# Patient Record
Sex: Male | Born: 1935 | Race: White | Hispanic: No | Smoking: Former smoker
Health system: Southern US, Community
[De-identification: ages and names within clinical notes are randomized; demographics above are authoritative.]

## PROBLEM LIST (undated history)

## (undated) DIAGNOSIS — K5730 Diverticulosis of large intestine without perforation or abscess without bleeding: Secondary | ICD-10-CM

## (undated) DIAGNOSIS — F0390 Unspecified dementia without behavioral disturbance: Secondary | ICD-10-CM

## (undated) DIAGNOSIS — K635 Polyp of colon: Secondary | ICD-10-CM

## (undated) DIAGNOSIS — N400 Enlarged prostate without lower urinary tract symptoms: Secondary | ICD-10-CM

## (undated) DIAGNOSIS — R338 Other retention of urine: Secondary | ICD-10-CM

## (undated) DIAGNOSIS — L820 Inflamed seborrheic keratosis: Secondary | ICD-10-CM

## (undated) DIAGNOSIS — G4730 Sleep apnea, unspecified: Secondary | ICD-10-CM

## (undated) DIAGNOSIS — K430 Incisional hernia with obstruction, without gangrene: Secondary | ICD-10-CM

## (undated) DIAGNOSIS — A419 Sepsis, unspecified organism: Secondary | ICD-10-CM

## (undated) DIAGNOSIS — M170 Bilateral primary osteoarthritis of knee: Secondary | ICD-10-CM

## (undated) DIAGNOSIS — H3530 Unspecified macular degeneration: Secondary | ICD-10-CM

## (undated) DIAGNOSIS — R39198 Other difficulties with micturition: Secondary | ICD-10-CM

## (undated) HISTORY — DX: Sepsis, unspecified organism: A41.9

## (undated) HISTORY — DX: Other difficulties with micturition: R39.198

## (undated) HISTORY — DX: Unspecified macular degeneration: H35.30

## (undated) HISTORY — DX: Unspecified dementia, unspecified severity, without behavioral disturbance, psychotic disturbance, mood disturbance, and anxiety: F03.90

## (undated) HISTORY — DX: Bilateral primary osteoarthritis of knee: M17.0

## (undated) HISTORY — DX: Polyp of colon: K63.5

## (undated) HISTORY — DX: Inflamed seborrheic keratosis: L82.0

## (undated) HISTORY — DX: Incisional hernia with obstruction, without gangrene: K43.0

## (undated) HISTORY — DX: Diverticulosis of large intestine without perforation or abscess without bleeding: K57.30

## (undated) HISTORY — DX: Other retention of urine: R33.8

## (undated) HISTORY — PX: CATARACT EXTRACTION: SUR2

## (undated) HISTORY — PX: INGUINAL HERNIA REPAIR: SHX194

---

## 1985-08-11 HISTORY — PX: CHOLECYSTECTOMY: SHX55

## 1988-08-11 HISTORY — PX: STOMACH SURGERY: SHX791

## 1998-01-19 ENCOUNTER — Other Ambulatory Visit: Admission: RE | Admit: 1998-01-19 | Discharge: 1998-01-19 | Payer: Self-pay | Admitting: Dermatology

## 1998-07-30 ENCOUNTER — Ambulatory Visit: Admission: RE | Admit: 1998-07-30 | Discharge: 1998-07-30 | Payer: Self-pay | Admitting: Pulmonary Disease

## 1999-01-14 ENCOUNTER — Ambulatory Visit: Admission: RE | Admit: 1999-01-14 | Discharge: 1999-01-14 | Payer: Self-pay | Admitting: Pulmonary Disease

## 1999-05-17 ENCOUNTER — Ambulatory Visit (HOSPITAL_COMMUNITY): Admission: RE | Admit: 1999-05-17 | Discharge: 1999-05-17 | Payer: Self-pay | Admitting: Gastroenterology

## 2007-08-12 DIAGNOSIS — R338 Other retention of urine: Secondary | ICD-10-CM

## 2007-08-12 DIAGNOSIS — K430 Incisional hernia with obstruction, without gangrene: Secondary | ICD-10-CM

## 2007-08-12 HISTORY — PX: LAPAROSCOPIC ASSISTED VENTRAL HERNIA REPAIR: SHX6312

## 2007-08-12 HISTORY — DX: Incisional hernia with obstruction, without gangrene: K43.0

## 2007-08-12 HISTORY — DX: Other retention of urine: R33.8

## 2007-09-25 ENCOUNTER — Emergency Department (HOSPITAL_COMMUNITY): Admission: EM | Admit: 2007-09-25 | Discharge: 2007-09-25 | Payer: Self-pay | Admitting: Emergency Medicine

## 2007-11-16 ENCOUNTER — Ambulatory Visit (HOSPITAL_COMMUNITY): Admission: RE | Admit: 2007-11-16 | Discharge: 2007-11-16 | Payer: Self-pay | Admitting: General Surgery

## 2007-11-20 ENCOUNTER — Inpatient Hospital Stay (HOSPITAL_COMMUNITY): Admission: RE | Admit: 2007-11-20 | Discharge: 2007-11-22 | Payer: Self-pay | Admitting: Surgery

## 2009-07-10 ENCOUNTER — Encounter: Admission: RE | Admit: 2009-07-10 | Discharge: 2009-07-10 | Payer: Self-pay | Admitting: Family Medicine

## 2009-07-19 ENCOUNTER — Emergency Department (HOSPITAL_COMMUNITY): Admission: EM | Admit: 2009-07-19 | Discharge: 2009-07-19 | Payer: Self-pay | Admitting: Emergency Medicine

## 2009-08-14 ENCOUNTER — Encounter: Admission: RE | Admit: 2009-08-14 | Discharge: 2009-08-14 | Payer: Self-pay | Admitting: Neurosurgery

## 2010-09-01 ENCOUNTER — Encounter: Payer: Self-pay | Admitting: Neurosurgery

## 2010-12-24 NOTE — Discharge Summary (Signed)
NAME:  Nicholas Herring, Nicholas Herring NO.:  [id number]   MEDICAL RECORD NO.:  [id number]          PATIENT TYPE:  INP   LOCATION:  1408                         FACILITY:  [facility]   PHYSICIAN:  Nicholas Herring, M.D.  DATE OF BIRTH:  [birth year]   DATE OF ADMISSION:  11/18/2007  DATE OF DISCHARGE:  11/22/2007                               DISCHARGE SUMMARY   DISCHARGE DIAGNOSES:  1. Incarcerated ventral hernia, incarcerated umbilical hernia.  2. Postoperative ileus, resolved.  3. Postoperative urinary retention, resolved.  4. Allergies.   OPERATIONS PERFORMED:  The patient had a laparoscopic ventral hernia  repair on November 18, 2007, by Nicholas Herring, M.D.   HISTORY OF ILLNESS:  Nicholas Herring is a 77 year old white male patient of  Nicholas Herring of [facility] and Nicholas Herring, who had an  open cholecystectomy in 1987, had a hiatal hernia repair about 1990.  Developed a hernia in his upper abdomen that included the junction of  these two incisions.  He also has a chronically incarcerated umbilical  hernia.   He was originally scheduled for Nicholas Herring on Tuesday, November 16, 2007.  Because of a miscommunication, the case was cancelled.  I then talked to  him and brought him to the hospital for attempted laparoscopic ventral  hernia repair.   PAST MEDICAL HISTORY:  For his age, remarkably unremarkable.  He has  allergies, for which he is taking Xyzal, but no other significant  medical problems.   PHYSICAL EXAM:  He has this defect in his right upper quadrant between  his right subcostal and midline incision consistent with an incarcerated  ventral hernia, and then he has an umbilical hernia.  Neither, however,  are tender.  They are just chronically incarcerated, probably with  omentum.   HOSPITAL COURSE:  The patient was taken to the operating room on the day  of admission, where he underwent a laparoscopic ventral hernia repair  and a laparoscopic umbilical hernia  repair, covered with a single piece  of Parietex mesh.   Postoperatively the patient developed urinary retention the first  postoperative day.  I put a Foley in him and started him on Flomax.  We  left the Foley in for two days, then removed it, and he tolerated it  well.   Secondly, he developed abdominal distention consistent with a  postoperative ileus and I had to keep him for a couple of days for that  reason also, but he is now four days postop.  He is afebrile.  His  abdomen is soft.  His incisions look good.  He is passing flatus but he  is still mildly distended.   He is only on liquids but I think he can go home and advance his diet  slowly, and he is aware of that.   DISCHARGE INSTRUCTIONS:  1. Resume his home medicines, which were all p.r.n. medicines.  I gave      him some Vicodin for pin.  2. He is to eat lightly for a few days.  3. He can shower.  4. He is to  see me back in 3 weeks for follow-up.  5. He is given Vicodin for pain.   DISCHARGE CONDITION:  Good.      Nicholas Herring, M.D.  Electronically Signed     DHN/MEDQ  D:  11/22/2007  T:  11/22/2007  Job:  [id number]   cc:   Nicholas Herring, M.D.  Fax: [phone]   Nicholas Herring, M.D.  Fax: [phone]

## 2010-12-24 NOTE — Op Note (Signed)
NAME:  Nicholas Herring, Nicholas Herring NO.:  [id number]   MEDICAL RECORD NO.:  [id number]          PATIENT TYPE:  AMB   LOCATION:  DAY                          FACILITY:  [facility]   PHYSICIAN:  Sandria Bales. Ezzard Standing, M.D.  DATE OF BIRTH:  1926-09-05   DATE OF PROCEDURE:  11/18/2007  DATE OF DISCHARGE:                               OPERATIVE REPORT   Date of surgery ???   PREOPERATIVE DIAGNOSES:  1. Incarcerated ventral hernia.  2. Incarcerated umbilical hernia.   POSTOPERATIVE DIAGNOSES:  1. Incarcerated ventral hernia, approximately 5-cm defect.  2. Swiss cheese defect of upper abdomen.  3. Incarcerated 1.5-cm umbilical hernia.   PROCEDURE:  Laparoscopic repair of ventral hernia/umbilical hernia with  15 x 20-cm Parietex mesh.   SURGEON:  Sandria Bales. Ezzard Standing, M.D.  No first assistant.   ANESTHESIA:  General endotracheal.   ESTIMATED BLOOD LOSS:  Minimal.   INDICATION FOR PROCEDURE:  Mr. Serio is a 77 year old white male who  sees Dr. Robert Bellow as his primary medical doctor, Dr. Ritta Slot from  a GI standpoint.  He presented to the emergency room at [facility] on September 25, 2007, with abdominal pain.  This was felt to be  in part due to an incarcerated abdominal wall hernia.   I have discussed with him about repairing this hernia laparoscopically.  I have discussed the indications and potential complications.  The  potential complications include but are not limited to bleeding,  infection, nerve injury, recurrence of the hernia and bowel injury.   OPERATIVE NOTE:  Patient placed in a supine position, given a general  endotracheal anesthetic.  He was given 1 g of Ancef at the initiation of  the procedure.  His abdomen was prepped with Betadine solution and  sterilely draped.  I did put an Ioban drape over his abdomen.  I  accessed the abdomen through the left upper quadrant and U got a 10-mm  Ethicon Optiview trocar in the abdominal cavity.  I then placed a  5-mm  in the left lower quadrant and a 5-mm right lower quadrant trocar.   I spent probably an hour taking down adhesions.  He had an umbilical  hernia which was incarcerated.  I reduced most of this.  There may be  some fat in the hernia so there may be a little bit of a bulge.  He had  a 4.5-5 cm ventral hernia defect sort of between his upper midline  incision and his subcostal incision on the right.  This was incarcerated  with omental fat, which I reduced.  He also had three or four other  small abdominal hernias which were sort of like Swiss-cheese defects  probably about a centimeter in size, sort of around this larger hernia.   It looked like I could cover both the ventral hernias, the Swiss-cheese  defects, and the umbilical hernia with a single piece of mesh and that a  15 x 20-cm Parietex mesh would be appropriate.   I put eight sutures in the mesh.  I then inserted the mesh into the  abdominal cavity  and retrieved these sutures using an endo-suture  device.  I tied this down, which then pulled the mesh to the anterior  abdominal wall, then fired the stapler, I think approximately 38 times.  I stapled at 1-cm intervals around the edge of the mesh and then I put  some staples in the middle to give a little bit of a tenting effect to  the mesh.  I desufflated the abdomen once and looked at the mesh and it  looked like there were no gaps, and then reinsufflated the mesh and then  pulled my trocars out entirely.  I also reinspected the omentum.  I  think there was bleeding some early but at the end of the procedure it  looked like that had all been controlled.   I closed each of the trocar sites with a 5-0 Vicryl suture.  I painted  each wound with tincture of Benzoin and steri-stripped.  The patient  tolerated the procedure well, was transported to the recovery room in  good condition.  The sponge and needle count were correct at the end of  the case.      Sandria Bales.  Ezzard Standing, M.D.  Electronically Signed     DHN/MEDQ  D:  11/18/2007  T:  11/18/2007  Job:  [id number]   cc:   Jonita Albee, M.D.  Fax: [phone]   Griffith Citron, M.D.  Fax: [phone]

## 2010-12-24 NOTE — Consult Note (Signed)
NAME:  Nicholas, Herring NO.:  [id number]   MEDICAL RECORD NO.:  [id number]          PATIENT TYPE:  EMS   LOCATION:  ED                           FACILITY:  [facility]   PHYSICIAN:  Anselm Pancoast. Weatherly, M.D.DATE OF BIRTH:  1926-10-28   DATE OF CONSULTATION:  09/25/2007  DATE OF DISCHARGE:                                 CONSULTATION   CHIEF COMPLAINT:  Painful hernia.   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  He has also had a past  history of polyps removed by Dr. Kinnie Scales and says it has been probably 5  years since he has had a colonoscopy.  When he came to the emergency  room, at first while he was eating at the pain, then he went home, his  wife drove, and after lying down and not improving, he came to the  emergency room, arrived here about 4:30 and was seen by the ER  physician, given some pain medication and then the hernia kind of  spontaneously reduced and he has been asymptomatic since then.  They  went ahead and proceeded with a CT that shows a little bit of gas in the  small bowel colon.  There is no incarceration of anything in the hernia  at this time.  It is probably a 50-cent fascial defect with a fairly  prominent hernia sac that goes up in the area and the patient is  completely asymptomatic now.   He says that he is not on any chronic medications as far as high blood  pressure or cardiac medications, etc.  He a session of court this next  week.  He has also has some inguinal hernias repaired, I think he has  had  mesh in those, which he thinks Dr. Earlene Plater has done his surgeries.   When he first arrived the white count was 14,800, his hematocrit 44.  CMET was unremarkable.  Urinalysis is negative with no blood or pus  cells in his urine.  He has never been febrile and the ER physician said  that basically while he was examining him after the pain medication, the  hernia just kind of spontaneously reduced.  He did not actually reduce  it physically.   PHYSICAL EXAM:  At this time is about 6 o'clock.  His blood pressure  133/68, his pulse 78, respirations 16.  He has always been afebrile.  He has no cramping or active bowel sounds.  He is not nauseated and the  hernia, when he strains it protrudes up but when he relaxes, it reduces  spontaneously, and he is certainly not  tender in the other parts of his  abdomen.    I did not do a rectal examination but would recommend that we see him  electively in the office.  He is going to contact Dr. Jennye Boroughs office.  He said Dr. Kinnie Scales told him 2 years ago he needed another colonoscopy,  and I would recommend that if he needs, go ahead and get it done.  Clinically now I think it is completely unrelated to the hernia but if  he needs anything done, they would be wise to get that checked first.  If he would have cramping abdominal pain or anything, he will see me  sooner.  Otherwise he will call and get an appointment with either me or  Dr. Kendrick Ranch.  He says his schedule for about the next 2-3 weeks is  busy but he is kind on call.  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  The patient  was accompanied by his wife, who  will be driving him home, and they are aware that if he is having any  cramping lower abdominal pain to give [place] a call an we will be happy to  see him back in the emergency room.   IMPRESSION:  Incisional hernia from an old cholecystectomy midline  incision from previous hiatus hernia  repair.           ______________________________  Anselm Pancoast. Zachery Dakins, M.D.     WJW/MEDQ  D:  09/25/2007  T:  09/27/2007  Job:  [id number]

## 2011-05-02 LAB — CBC
HCT: 44.1
MCHC: 34.6
Platelets: 186
RBC: 4.75

## 2011-05-02 LAB — COMPREHENSIVE METABOLIC PANEL
ALT: 20
AST: 17
BUN: 15
Chloride: 103
Glucose, Bld: 181 — ABNORMAL HIGH
Sodium: 137

## 2011-05-02 LAB — DIFFERENTIAL: Monocytes Relative: 4

## 2011-05-02 LAB — URINALYSIS, ROUTINE W REFLEX MICROSCOPIC
Bilirubin Urine: NEGATIVE
Ketones, ur: 40 — AB
Nitrite: NEGATIVE

## 2011-05-06 LAB — CBC
HCT: 45.1
MCV: 93.1
Platelets: 160
RDW: 14.2
WBC: 8

## 2011-05-06 LAB — COMPREHENSIVE METABOLIC PANEL
Albumin: 3.7
Alkaline Phosphatase: 80
CO2: 28
Chloride: 104
Creatinine, Ser: 0.89
Total Protein: 6.3

## 2011-05-06 LAB — DIFFERENTIAL
Basophils Absolute: 0.1
Basophils Relative: 1
Eosinophils Absolute: 0.4
Lymphocytes Relative: 21
Lymphs Abs: 1.7
Monocytes Absolute: 0.6
Neutrophils Relative %: 65

## 2011-08-12 DIAGNOSIS — L820 Inflamed seborrheic keratosis: Secondary | ICD-10-CM

## 2011-08-12 HISTORY — DX: Inflamed seborrheic keratosis: L82.0

## 2012-05-06 ENCOUNTER — Other Ambulatory Visit: Payer: Self-pay | Admitting: Dermatology

## 2013-02-01 ENCOUNTER — Ambulatory Visit: Payer: 59

## 2013-02-01 ENCOUNTER — Ambulatory Visit (INDEPENDENT_AMBULATORY_CARE_PROVIDER_SITE_OTHER): Payer: 59 | Admitting: Family Medicine

## 2013-02-01 VITALS — BP 117/62 | HR 77 | Temp 98.0°F | Resp 17 | Ht 70.0 in | Wt 193.0 lb

## 2013-02-01 DIAGNOSIS — S0181XA Laceration without foreign body of other part of head, initial encounter: Secondary | ICD-10-CM

## 2013-02-01 DIAGNOSIS — S61209A Unspecified open wound of unspecified finger without damage to nail, initial encounter: Secondary | ICD-10-CM

## 2013-02-01 DIAGNOSIS — M79609 Pain in unspecified limb: Secondary | ICD-10-CM

## 2013-02-01 DIAGNOSIS — S62609A Fracture of unspecified phalanx of unspecified finger, initial encounter for closed fracture: Secondary | ICD-10-CM

## 2013-02-01 DIAGNOSIS — S0180XA Unspecified open wound of other part of head, initial encounter: Secondary | ICD-10-CM

## 2013-02-01 DIAGNOSIS — M79642 Pain in left hand: Secondary | ICD-10-CM

## 2013-02-01 NOTE — Progress Notes (Signed)
79 year old gentleman comes in with his wife  ( who is on oxygen) after he fell while carrying porkchops out to the [place]. He had his left for head, his left chin, his left hand with lacerations to each.  Had no loss of consciousness, no nausea or vomiting, no diplopia, no significant headache.  Patient is not taking anticoagulants at this time.  Objective: Patient is alert, cooperative, and appropriate Neurologically: Patient has a normal mental status, normal cranial nerves III through XII, normal motor exam, normal gait Pupils equal and reactive to light and accommodation with full range of motion Normal facial movement, no chipped teeth, no oral lesions Full range of jaw, lower cheek has a 2 cm linear laceration which was Dermabond Left for head shows ecchymotic swelling of about 1 cm with overlying half centimeter linear laceration which is closed and not bleeding Patient has an angular laceration of the left hand at the base of the thumb which was repaired by the physician's assistant [facility] reading (PRIMARY) by  Dr. Milus Glazier:  Base of pinky and ring finger metacarpals show fractures with 1/4" displacement   Assessment: Closed head injury without signs of concussion, facial laceration which was closed with Dermabond, for head contusion which is small and associated with small laceration, left hand 3 cm angulated superficial laceration which was repaired, and boxer type fracture left hand  Plan: Pain medicine and orthopedic consultation. Followup for lacerations in one week Patient will try aleve for pain control.  If not sufficient, we can call in Ultram 50 mg #30 every 6 hrs prn  Signed, Elvina Sidle, MD.

## 2013-02-01 NOTE — Progress Notes (Signed)
Verbal consent obtained from the patient.  Local anesthesia with 2cc Lidocaine 2% without epinephrine to the wound on the LEFT hand.  Wound scrubbed with soap and water and rinsed.  Wound closed with #2 4-0 Ethilon horizontal mattress sutures.  Wound cleansed and dressed.  Ulnar gutter splint placed.

## 2013-08-09 ENCOUNTER — Emergency Department (HOSPITAL_COMMUNITY)
Admission: EM | Admit: 2013-08-09 | Discharge: 2013-08-09 | Disposition: A | Discharge status: Home / Self Care | Payer: Medicare Other | Attending: Emergency Medicine | Admitting: Emergency Medicine

## 2013-08-09 ENCOUNTER — Encounter (HOSPITAL_COMMUNITY): Payer: Self-pay | Admitting: Emergency Medicine

## 2013-08-09 DIAGNOSIS — Z87891 Personal history of nicotine dependence: Secondary | ICD-10-CM | POA: Insufficient documentation

## 2013-08-09 DIAGNOSIS — Z9089 Acquired absence of other organs: Secondary | ICD-10-CM | POA: Insufficient documentation

## 2013-08-09 DIAGNOSIS — K469 Unspecified abdominal hernia without obstruction or gangrene: Secondary | ICD-10-CM | POA: Insufficient documentation

## 2013-08-09 NOTE — ED Provider Notes (Signed)
CSN: [id number]     Arrival date & time 08/09/13  1213 History   First MD Initiated Contact with Patient 08/09/13 1540     Chief Complaint  Patient presents with  . Hernia    HPI Patient presents to the emergency room with complaints of a presumed hernia. Patient states over the last several months he's had intermittent pain and swelling in his right inguinal region. Patient states the symptoms would come and go. He really never that bothersome so he did not seek any medical treatment. Patient has a history of a right inguinal hernia repair and felt that this was very similar to his hernia symptoms previously. This morning, he was having swelling and increasing discomfort. The hernia pain persisted for several hours so he decided to come to the emergency room to be evaluated. Prior to being seen, the patient states all his symptoms have resolved. He no longer is having any pain or swelling. He has not any trouble with nausea, vomiting, diarrhea or fever. Past Medical History  Diagnosis Date  . Hernia    Past Surgical History  Procedure Laterality Date  . Cholecystectomy    . Hernia repair     History reviewed. No pertinent family history. History  Substance Use Topics  . Smoking status: Former Games developer  . Smokeless tobacco: Not on file  . Alcohol Use: Yes    Review of Systems  All other systems reviewed and are negative.    Allergies  Morphine and related  Home Medications  No current outpatient prescriptions on file. BP 131/69  Pulse 74  Temp(Src) 97.7 F (36.5 C) (Oral)  Resp 17  SpO2 97% Physical Exam  Nursing note and vitals reviewed. Constitutional: He appears well-developed and well-nourished. No distress.  HENT:  Head: Normocephalic and atraumatic.  Right Ear: External ear normal.  Left Ear: External ear normal.  Eyes: Conjunctivae are normal. Right eye exhibits no discharge. Left eye exhibits no discharge. No scleral icterus.  Neck: Neck supple. No tracheal  deviation present.  Cardiovascular: Normal rate.   Pulmonary/Chest: Effort normal. No stridor. No respiratory distress.  Abdominal: Soft. Bowel sounds are normal. He exhibits no distension. There is no tenderness. There is no rebound and no guarding.  Well-healed inguinal hernia scar on the right side, no palpable mass with patient standing or during Valsalva maneuver  Musculoskeletal: He exhibits no edema.  Neurological: He is alert. Cranial nerve deficit: no gross deficits.  Skin: Skin is warm and dry. No rash noted.  Psychiatric: He has a normal mood and affect.    ED Course  Procedures (including critical care time) Labs Review Labs Reviewed - No data to display Imaging Review No results found.  EKG Interpretation   None       MDM   1. Hernia     I suspect the patient has an inguinal hernia. Was not able to reproduce it on my exam. Fortunately, he has no evidence to suggest an incarcerated restraining you hernia.  The patient has seen Dr. Ezzard Standing previously. Recommend he followup with him and schedule an appointment. Patient understands to return to emergency room for recurrent symptoms, vomiting, fever, persistent swelling   Celene Kras, MD 08/09/13 [id number]

## 2013-08-09 NOTE — ED Notes (Signed)
Pt states he has hernia, RLQ, states he has these before, states he has surgery before, states painful when pressing down, no pain at present

## 2013-08-17 ENCOUNTER — Encounter (INDEPENDENT_AMBULATORY_CARE_PROVIDER_SITE_OTHER): Payer: Self-pay | Admitting: Surgery

## 2013-08-17 ENCOUNTER — Ambulatory Visit (INDEPENDENT_AMBULATORY_CARE_PROVIDER_SITE_OTHER): Payer: Medicare Other | Admitting: Surgery

## 2013-08-17 ENCOUNTER — Encounter (HOSPITAL_COMMUNITY): Payer: Self-pay

## 2013-08-17 VITALS — BP 102/62 | HR 72 | Temp 97.3°F | Resp 14 | Ht 72.0 in | Wt 181.4 lb

## 2013-08-17 DIAGNOSIS — R3989 Other symptoms and signs involving the genitourinary system: Secondary | ICD-10-CM

## 2013-08-17 DIAGNOSIS — R39198 Other difficulties with micturition: Secondary | ICD-10-CM

## 2013-08-17 DIAGNOSIS — K4091 Unilateral inguinal hernia, without obstruction or gangrene, recurrent: Secondary | ICD-10-CM

## 2013-08-17 DIAGNOSIS — K4021 Bilateral inguinal hernia, without obstruction or gangrene, recurrent: Secondary | ICD-10-CM | POA: Insufficient documentation

## 2013-08-17 DIAGNOSIS — M170 Bilateral primary osteoarthritis of knee: Secondary | ICD-10-CM | POA: Insufficient documentation

## 2013-08-17 DIAGNOSIS — K4090 Unilateral inguinal hernia, without obstruction or gangrene, not specified as recurrent: Secondary | ICD-10-CM

## 2013-08-17 DIAGNOSIS — K449 Diaphragmatic hernia without obstruction or gangrene: Secondary | ICD-10-CM | POA: Insufficient documentation

## 2013-08-17 HISTORY — DX: Bilateral primary osteoarthritis of knee: M17.0

## 2013-08-17 MED ORDER — TAMSULOSIN HCL 0.4 MG PO CAPS: 0.4000 mg | ORAL_CAPSULE | Freq: Every day | ORAL | Status: DC

## 2013-08-17 NOTE — Progress Notes (Signed)
Subjective:     Patient ID: Nicholas Herring, male   DOB: 08-Sep-1926, 80 y.o.   MRN: [id number]  HPI  Note: This dictation was prepared with Dragon/digital dictation along with [facility] technology. Any transcriptional errors that result from this process are unintentional.       LAURENCE CROFFORD  May 02, 1927 [id number]  Patient Care Team: Jonita Albee, MD as PCP - General (Internal Medicine) Griffith Citron, MD as Consulting Physician (Gastroenterology) Lunette Stands, MD as Consulting Physician (Orthopedic Surgery)  This patient is a 80 y.o.male who presents today for surgical evaluation at the request of Dr. Linwood Dibbles, [facility] ED.   Reason for visit: Intermittent right groin bulging with pain/discomfort.  Concern for recurrent hernia.  Pleasant elderly gentleman who tends to avoid doctors.  Used to be very active.  Worsening arthritis of knees has limited some mobility.  He had a hernia repair of his right groin as a child.  Found to have an incarcerated periumbilical ventral hernia 5 years ago.  It took some time sorting out his history, but eventually I found out he actually had surgery with [place].  Initially planned to have surgery Dr. Zachery Dakins.  Lost to followup.  He came in urgently.  Had to have surgery done by Dr. Ezzard Standing April 2009.  Eventually recovered from that.  He is still rather active.  A few weeks ago he noticed a painful lump in his right groin.  If he lies down it can reduce.  It recurred.  He went to the emergency room.  Suspicion but no definite diagnosis of hernia.  Surgical consultation recommended.  The patient notes that he can be quite uncomfortable when it is out.  He pretty much as to stay in bed until it can massaged back in.  This concerns him.  He wishes to be aggressive and have something done.  Therefore, he comes to the office considering surgery.  Patient Active Problem List   Diagnosis Date Noted  . Hiatal hernia 08/17/2013  . Osteoarthritis of both knees  08/17/2013  . Recurrent right inguinal hernia 08/17/2013  . Inguinal hernia, left 08/17/2013  . Difficulty in urination     Past Medical History  Diagnosis Date  . Incarcerated incisional hernia 2009  . Difficulty in urination   . Macular degeneration   . Colon polyps   . Seborrheic keratoses, inflamed 2013  . Diverticulosis of sigmoid colon   . Osteoarthritis of both knees 08/17/2013  . Acute urinary retention 2009    Resolved after foley & flomax    Past Surgical History  Procedure Laterality Date  . Cholecystectomy  1987  . Laparoscopic assisted ventral hernia repair  2009    Dr Ezzard Standing  . Stomach surgery  1990    reflux surgery?  . Inguinal hernia repair Right infant    History   Social History  . Marital Status: Married    Spouse Name: N/A    Number of Children: N/A  . Years of Education: N/A   Occupational History  . Retired   . Lawyer/Judge    Social History Main Topics  . Smoking status: Former Smoker    Quit date: 08/12/1971  . Smokeless tobacco: Never Used  . Alcohol Use: 3.6 oz/week    6 Cans of beer per week     Comment: weekly  . Drug Use: No  . Sexual Activity: Not on file   Other Topics Concern  . Not on file   Social History Narrative  Lives with his wife.  [employer], class of '52.    History reviewed. No pertinent family history.  Current Outpatient Prescriptions  Medication Sig Dispense Refill  . calcium carbonate (TUMS EX) 750 MG chewable tablet Chew 1 tablet by mouth daily.      . diphenhydrAMINE (BENADRYL) 25 MG tablet Take 25 mg by mouth every 6 (six) hours as needed.      . naproxen sodium (ANAPROX) 220 MG tablet Take 220 mg by mouth 2 (two) times daily with a meal.      . tamsulosin (FLOMAX) 0.4 MG CAPS capsule Take 1 capsule (0.4 mg total) by mouth daily.  30 capsule  1   No current facility-administered medications for this visit.     Allergies  Allergen Reactions  . Morphine And Related Nausea And  Vomiting    BP 102/62  Pulse 72  Temp(Src) 97.3 F (36.3 C) (Temporal)  Resp 14  Ht 6' (1.829 m)  Wt 181 lb 6.4 oz (82.283 kg)  BMI 24.60 kg/m2  No results found.   Review of Systems  Constitutional: Negative for fever, chills and diaphoresis.  HENT: Negative for ear discharge, facial swelling, mouth sores, nosebleeds, sore throat and trouble swallowing.   Eyes: Negative for photophobia, discharge and visual disturbance.  Respiratory: Negative for choking, chest tightness, shortness of breath and stridor.   Cardiovascular: Negative for chest pain and palpitations.  Gastrointestinal: Negative for nausea, vomiting, abdominal pain, diarrhea, constipation, blood in stool, abdominal distention, anal bleeding and rectal pain.  Endocrine: Negative for cold intolerance and heat intolerance.  Genitourinary: Negative for dysuria, urgency, difficulty urinating and testicular pain.  Musculoskeletal: Negative for arthralgias, back pain, gait problem and myalgias.  Skin: Negative for color change, pallor, rash and wound.  Allergic/Immunologic: Negative for environmental allergies and food allergies.  Neurological: Negative for dizziness, speech difficulty, weakness, numbness and headaches.  Hematological: Negative for adenopathy. Does not bruise/bleed easily.  Psychiatric/Behavioral: Negative for hallucinations, confusion and agitation.       Objective:   Physical Exam  Constitutional: He is oriented to person, place, and time. He appears well-developed and well-nourished. He is cooperative. No distress.  HENT:  Head: Normocephalic.  Right Ear: External ear normal. No drainage. Decreased hearing is noted.  Left Ear: External ear normal. No drainage. Decreased hearing is noted.  Mouth/Throat: Oropharynx is clear and moist. No oropharyngeal exudate.  Eyes: Conjunctivae and EOM are normal. Pupils are equal, round, and reactive to light. No scleral icterus.  Neck: Normal range of motion. Neck  supple. No tracheal deviation present.  Cardiovascular: Normal rate, regular rhythm and intact distal pulses.   Pulmonary/Chest: Effort normal and breath sounds normal. No respiratory distress.  Abdominal: Soft. He exhibits no distension. There is no tenderness. There is no rigidity, no rebound, no guarding, no CVA tenderness, no tenderness at McBurney's point and negative Murphy's sign. A hernia is present. Hernia confirmed positive in the right inguinal area and confirmed positive in the left inguinal area. Hernia confirmed negative in the ventral area.    Musculoskeletal: Normal range of motion. He exhibits no tenderness.  Lymphadenopathy:    He has no cervical adenopathy.       Right: No inguinal adenopathy present.       Left: No inguinal adenopathy present.  Neurological: He is alert and oriented to person, place, and time. No cranial nerve deficit. He exhibits normal muscle tone. Coordination normal.  Skin: Skin is warm and dry. No rash noted. He is  not diaphoretic. No erythema. No pallor.  Psychiatric: He has a normal mood and affect. Judgment and thought content normal. His speech is delayed. He is slowed. Cognition and memory are normal.       Assessment:     Recurrent right and probable left new inguinal hernias     Plan:     I offered observation vs. Surgery.  He notes that the hernia is popping out more often and becoming much more comfortable.  He has a pretty good quality of life and is functional.  He wishes to be aggressive and have this repaired.  He had prior hernia surgery.  On 5 years ago by my partner, Dr. Ezzard StandingNewman.  I mentioned this to him.  He felt comfortable having myself proceed with the surgery.  He would require a TAPP repair as opposed to a preperitoneal TEP type repair.  Possible open operation as backup plan.  I discussed with him:  The anatomy & physiology of the abdominal wall and pelvic floor was discussed.  The pathophysiology of hernias in the inguinal and  pelvic region was discussed.  Natural history risks such as progressive enlargement, pain, incarceration & strangulation was discussed.   Contributors to complications such as smoking, obesity, diabetes, prior surgery, etc were discussed.    I feel the risks of no intervention will lead to serious problems that outweigh the operative risks; therefore, I recommended surgery to reduce and repair the hernia.  I explained laparoscopic techniques with possible need for an open approach.  I noted usual use of mesh to patch and/or buttress hernia repair  Risks such as bleeding, infection, abscess, need for further treatment, heart attack, death, and other risks were discussed.  I noted a good likelihood this will help address the problem.   Goals of post-operative recovery were discussed as well.  Possibility that this will not correct all symptoms was explained.  I stressed the importance of low-impact activity, aggressive pain control, avoiding constipation, & not pushing through pain to minimize risk of post-operative chronic pain or injury. Possibility of reherniation was discussed.  We will work to minimize complications.     An educational handout further explaining the pathology & treatment options was given as well.  Questions were answered.  The patient expresses understanding & wishes to proceed with surgery.  With his issues of prior urinary retention with less surgery, I recommend he start Flomax up to minimize the chance of urinary retention.  If he has persistent issues, reconsider seeing oyology for this.  He tends to avoid doctors and does not want to do that.  I did not insist.

## 2013-08-17 NOTE — Patient Instructions (Signed)
See the Handout(s) we gave you.  Consider surgery To repair the hernias in your right and probable left groins.  Please call our office at [phone] if you wish to schedule surgery or if you have further questions / concerns.   Start tamsulosin/Flomax to minimize the chance that he will have urinary retention and need a Foley catheter  Hernia A hernia occurs when an internal organ pushes out through a weak spot in the abdominal wall. Hernias most commonly occur in the groin and around the navel. Hernias often can be pushed back into place (reduced). Most hernias tend to get worse over time. Some abdominal hernias can get stuck in the opening (irreducible or incarcerated hernia) and cannot be reduced. An irreducible abdominal hernia which is tightly squeezed into the opening is at risk for impaired blood supply (strangulated hernia). A strangulated hernia is a medical emergency. Because of the risk for an irreducible or strangulated hernia, surgery may be recommended to repair a hernia. CAUSES   Heavy lifting.  Prolonged coughing.  Straining to have a bowel movement.  A cut (incision) made during an abdominal surgery. HOME CARE INSTRUCTIONS   Bed rest is not required. You may continue your normal activities.  Avoid lifting more than 10 pounds (4.5 kg) or straining.  Cough gently. If you are a smoker it is best to stop. Even the best hernia repair can break down with the continual strain of coughing. Even if you do not have your hernia repaired, a cough will continue to aggravate the problem.  Do not wear anything tight over your hernia. Do not try to keep it in with an outside bandage or truss. These can damage abdominal contents if they are trapped within the hernia sac.  Eat a normal diet.  Avoid constipation. Straining over long periods of time will increase hernia size and encourage breakdown of repairs. If you cannot do this with diet alone, stool softeners may be used. SEEK  IMMEDIATE MEDICAL CARE IF:   You have a fever.  You develop increasing abdominal pain.  You feel nauseous or vomit.  Your hernia is stuck outside the abdomen, looks discolored, feels hard, or is tender.  You have any changes in your bowel habits or in the hernia that are unusual for you.  You have increased pain or swelling around the hernia.  You cannot push the hernia back in place by applying gentle pressure while lying down. MAKE SURE YOU:   Understand these instructions.  Will watch your condition.  Will get help right away if you are not doing well or get worse. Document Released: 07/28/2005 Document Revised: 10/20/2011 Document Reviewed: 03/16/2008 [facility] Patient Information 2014 [place], [place]. Benign Prostatic Hyperplasia An enlarged prostate (benign prostatic hyperplasia) is common in older men. You may experience the following:  Weak urine stream.  Dribbling.  Feeling like the bladder has not emptied completely.  Difficulty starting urination.  Getting up frequently at night to urinate.  Urinating more frequently during the day. HOME CARE INSTRUCTIONS  Monitor your prostatic hyperplasia for any changes. The following actions may help to alleviate any discomfort you are experiencing:  Give yourself time when you urinate.  Stay away from alcohol.  Avoid beverages containing caffeine, such as coffee, tea, and colas, because they can make the problem worse.  Avoid decongestants, antihistamines, and some prescription medicines that can make the problem worse.  Follow up with your health care provider for further treatment as recommended. SEEK MEDICAL CARE IF:  You are experiencing progressive difficulty voiding.  Your urine stream is progressively getting narrower.  You are awaking from sleep with the urge to void more frequently.  You are constantly feeling the need to void.  You experience loss of urine, especially in small amounts. SEEK  IMMEDIATE MEDICAL CARE IF:   You develop increased pain with urination or are unable to urinate.  You develop severe abdominal pain, vomiting, a high fever, or fainting.  You develop back pain or blood in your urine. MAKE SURE YOU:   Understand these instructions.  Will watch your condition.  Will get help right away if you are not doing well or get worse. Document Released: 07/28/2005 Document Revised: 03/30/2013 Document Reviewed: 12/28/2012 [facility] Patient Information 2014 [place], [place].  HERNIA REPAIR: POST OP INSTRUCTIONS  1. DIET: Follow a light bland diet the first 24 hours after arrival home, such as soup, liquids, crackers, etc.  Be sure to include lots of fluids daily.  Avoid fast food or heavy meals as your are more likely to get nauseated.  Eat a low fat the next few days after surgery. 2. Take your usually prescribed home medications unless otherwise directed. 3. PAIN CONTROL: a. Pain is best controlled by a usual combination of three different methods TOGETHER: i. Ice/Heat ii. Over the counter pain medication iii. Prescription pain medication b. Most patients will experience some swelling and bruising around the hernia(s) such as the bellybutton, groins, or old incisions.  Ice packs or heating pads (30-60 minutes up to 6 times a day) will help. Use ice for the first few days to help decrease swelling and bruising, then switch to heat to help relax tight/sore spots and speed recovery.  Some people prefer to use ice alone, heat alone, alternating between ice & heat.  Experiment to what works for you.  Swelling and bruising can take several weeks to resolve.   c. It is helpful to take an over-the-counter pain medication regularly for the first few weeks.  Choose one of the following that works best for you: i. Naproxen (Aleve, etc)  Two 220mg  tabs twice a day ii. Ibuprofen (Advil, etc) Three 200mg  tabs four times a day (every meal & bedtime) iii. Acetaminophen (Tylenol,  etc) 325-650mg  four times a day (every meal & bedtime) d. A  prescription for pain medication should be given to you upon discharge.  Take your pain medication as prescribed.  i. If you are having problems/concerns with the prescription medicine (does not control pain, nausea, vomiting, rash, itching, etc), please call us [phone] to see if we need to switch you to a different pain medicine that will work better for you and/or control your side effect better. ii. If you need a refill on your pain medication, please contact your pharmacy.  They will contact our office to request authorization. Prescriptions will not be filled after 5 pm or on week-ends. 4. Avoid getting constipated.  Between the surgery and the pain medications, it is common to experience some constipation.  Increasing fluid intake and taking a fiber supplement (such as Metamucil, Citrucel, FiberCon, MiraLax, etc) 1-2 times a day regularly will usually help prevent this problem from occurring.  A mild laxative (prune juice, Milk of Magnesia, MiraLax, etc) should be taken according to package directions if there are no bowel movements after 48 hours.   5. Wash / shower every day.  You may shower over the dressings as they are waterproof.   6. Remove your waterproof bandages 5  days after surgery.  You may leave the incision open to air.  You may replace a dressing/Band-Aid to cover the incision for comfort if you wish.  Continue to shower over incision(s) after the dressing is off.    7. ACTIVITIES as tolerated:   a. You may resume regular (light) daily activities beginning the next day-such as daily self-care, walking, climbing stairs-gradually increasing activities as tolerated.  If you can walk 30 minutes without difficulty, it is safe to try more intense activity such as jogging, treadmill, bicycling, low-impact aerobics, swimming, etc. b. Save the most intensive and strenuous activity for last such as sit-ups, heavy lifting,  contact sports, etc  Refrain from any heavy lifting or straining until you are off narcotics for pain control.   c. DO NOT PUSH THROUGH PAIN.  Let pain be your guide: If it hurts to do something, don't do it.  Pain is your body warning you to avoid that activity for another week until the pain goes down. d. You may drive when you are no longer taking prescription pain medication, you can comfortably wear a seatbelt, and you can safely maneuver your car and apply brakes. e. Bonita QuinYou may have sexual intercourse when it is comfortable.  8. FOLLOW UP in our office a. Please call [facility] at [phone] to set up an appointment to see your surgeon in the office for a follow-up appointment approximately 2-3 weeks after your surgery. b. Make sure that you call for this appointment the day you arrive home to insure a convenient appointment time. 9.  IF YOU HAVE DISABILITY OR FAMILY LEAVE FORMS, BRING THEM TO THE OFFICE FOR PROCESSING.  DO NOT GIVE THEM TO YOUR DOCTOR.  WHEN TO CALL US [phone]: 1. Poor pain control 2. Reactions / problems with new medications (rash/itching, nausea, etc)  3. Fever over 101.5 F (38.5 C) 4. Inability to urinate 5. Nausea and/or vomiting 6. Worsening swelling or bruising 7. Continued bleeding from incision. 8. Increased pain, redness, or drainage from the incision   The clinic staff is available to answer your questions during regular business hours (8:30am-5pm).  Please don't hesitate to call and ask to speak to one of our nurses for clinical concerns.   If you have a medical emergency, go to the nearest emergency room or call 911.  A surgeon from [facility] is always on call at the hospitals in [facility]  [facility], [place] [address], [address], [place], [place]  [address] ?  [address] [address], [place], [place]   [address] MAIN: [phone] ? TOLL FREE: [phone] ? FAX: [phone] [email or web address]

## 2013-08-19 NOTE — Pre-Procedure Instructions (Signed)
Nicholas Herring  08/19/2013   Your procedure is scheduled on:  Tuesday August 23, 2013 @ 11:30 AM.  [facility]  Admitting at 9:30 AM.  Call this number if you have problems the morning of surgery: [phone]   Remember:   Do not eat food or drink liquids after midnight.   Take these medicines the morning of surgery with A SIP OF WATER: Tamsulosin (Flomax)  Discontinue aspirin and herbal medications 5 days prior to surgery    OK to continue Aspirin if less than 350mg  / day       Do not wear jewelry, make-up or nail polish.  Do not wear lotions, powders, or perfumes. You may wear deodorant.  Do not shave 48 hours prior to surgery.   Do not bring valuables to the hospital.  [facility] is not responsible for any belongings or valuables.               Contacts, dentures or bridgework may not be worn into surgery.  Leave suitcase in the car. After surgery it may be brought to your room.  For patients admitted to the hospital, discharge time is determined by your treatment team.               Patients discharged the day of surgery will not be allowed to drive home.  Name and phone number of your driver: Family/Friend  Special Instructions: Shower using CHG 2 nights before surgery and the night before surgery.  If you shower the day of surgery use CHG.  Use special wash - you have one bottle of CHG for all showers.  You should use approximately 1/3 of the bottle for each shower.   Please read over the following fact sheets that you were given: Pain Booklet, Coughing and Deep Breathing and Surgical Site Infection Prevention

## 2013-08-22 ENCOUNTER — Telehealth (INDEPENDENT_AMBULATORY_CARE_PROVIDER_SITE_OTHER): Payer: Self-pay

## 2013-08-22 ENCOUNTER — Encounter (HOSPITAL_COMMUNITY): Payer: Self-pay

## 2013-08-22 ENCOUNTER — Encounter (HOSPITAL_COMMUNITY)
Admission: RE | Admit: 2013-08-22 | Discharge: 2013-08-22 | Disposition: A | Discharge status: Home / Self Care | Payer: Medicare Other | Source: Ambulatory Visit | Attending: Surgery | Admitting: Surgery

## 2013-08-22 DIAGNOSIS — Z87891 Personal history of nicotine dependence: Secondary | ICD-10-CM | POA: Diagnosis not present

## 2013-08-22 DIAGNOSIS — G4730 Sleep apnea, unspecified: Secondary | ICD-10-CM | POA: Diagnosis not present

## 2013-08-22 DIAGNOSIS — K4021 Bilateral inguinal hernia, without obstruction or gangrene, recurrent: Secondary | ICD-10-CM | POA: Diagnosis not present

## 2013-08-22 HISTORY — DX: Sleep apnea, unspecified: G47.30

## 2013-08-22 HISTORY — DX: Benign prostatic hyperplasia without lower urinary tract symptoms: N40.0

## 2013-08-22 LAB — CBC
HEMATOCRIT: 41.1 % (ref 39.0–52.0)
Hemoglobin: 13.3 g/dL (ref 13.0–17.0)
MCH: 29.8 pg (ref 26.0–34.0)
MCHC: 32.4 g/dL (ref 30.0–36.0)
MCV: 91.9 fL (ref 78.0–100.0)
Platelets: 229 10*3/uL (ref 150–400)
RBC: 4.47 MIL/uL (ref 4.22–5.81)
RDW: 14.9 % (ref 11.5–15.5)
WBC: 7.3 10*3/uL (ref 4.0–10.5)

## 2013-08-22 LAB — BASIC METABOLIC PANEL
BUN: 16 mg/dL (ref 6–23)
CALCIUM: 9 mg/dL (ref 8.4–10.5)
CO2: 28 mEq/L (ref 19–32)
Chloride: 107 mEq/L (ref 96–112)
Creatinine, Ser: 0.64 mg/dL (ref 0.50–1.35)
GFR calc Af Amer: >90 mL/min (ref >90)
GFR, EST NON AFRICAN AMERICAN: 87 mL/min — AB (ref >90)
Glucose, Bld: 99 mg/dL (ref 70–99)
Potassium: 4.4 mEq/L (ref 3.7–5.3)
Sodium: 144 mEq/L (ref 137–147)

## 2013-08-22 MED ORDER — CHLORHEXIDINE GLUCONATE 4 % EX LIQD: 1.0000 "application " | Freq: Once | CUTANEOUS | Status: DC

## 2013-08-22 MED ORDER — CEFAZOLIN SODIUM-DEXTROSE 2-3 GM-% IV SOLR
2.0000 g | INTRAVENOUS | Status: AC
  Administered 2013-08-23: 2 g via INTRAVENOUS
  Filled 2013-08-22: qty 50

## 2013-08-22 NOTE — Telephone Encounter (Signed)
Daughter called stating DR. Gross did not call Flomax in to [facility]. Spoke to DR. Gross he give order to fill Flomax [facility] [phone] Daughter is aware

## 2013-08-22 NOTE — Progress Notes (Signed)
Patient denied having a stress test or cardiac cath, but did inform Nurse that he has sleep apnea but does not wear CPAP as ordered to. PCP is Lunette StandsAnna [place]tek.

## 2013-08-23 ENCOUNTER — Ambulatory Visit (HOSPITAL_COMMUNITY): Payer: Medicare Other | Admitting: Certified Registered"

## 2013-08-23 ENCOUNTER — Encounter (HOSPITAL_COMMUNITY): Payer: Self-pay | Admitting: *Deleted

## 2013-08-23 ENCOUNTER — Ambulatory Visit (HOSPITAL_COMMUNITY)
Admission: RE | Admit: 2013-08-23 | Discharge: 2013-08-23 | Disposition: A | Discharge status: Home / Self Care | Payer: Medicare Other | Source: Ambulatory Visit | Attending: Surgery | Admitting: Surgery

## 2013-08-23 ENCOUNTER — Encounter (HOSPITAL_COMMUNITY): Payer: Medicare Other | Admitting: Certified Registered"

## 2013-08-23 ENCOUNTER — Encounter (HOSPITAL_COMMUNITY)
Admission: RE | Disposition: A | Discharge status: Home / Self Care | Payer: Self-pay | Source: Ambulatory Visit | Attending: Surgery

## 2013-08-23 DIAGNOSIS — K4091 Unilateral inguinal hernia, without obstruction or gangrene, recurrent: Secondary | ICD-10-CM

## 2013-08-23 DIAGNOSIS — Z87891 Personal history of nicotine dependence: Secondary | ICD-10-CM | POA: Insufficient documentation

## 2013-08-23 DIAGNOSIS — K4090 Unilateral inguinal hernia, without obstruction or gangrene, not specified as recurrent: Secondary | ICD-10-CM

## 2013-08-23 DIAGNOSIS — K4021 Bilateral inguinal hernia, without obstruction or gangrene, recurrent: Secondary | ICD-10-CM

## 2013-08-23 DIAGNOSIS — R39198 Other difficulties with micturition: Secondary | ICD-10-CM | POA: Diagnosis present

## 2013-08-23 DIAGNOSIS — G4730 Sleep apnea, unspecified: Secondary | ICD-10-CM | POA: Insufficient documentation

## 2013-08-23 HISTORY — PX: INGUINAL HERNIA REPAIR: SHX194

## 2013-08-23 HISTORY — PX: INSERTION OF MESH: SHX5868

## 2013-08-23 HISTORY — PX: LAPAROSCOPIC LYSIS OF ADHESIONS: SHX5905

## 2013-08-23 SURGERY — REPAIR, HERNIA, INGUINAL, LAPAROSCOPIC
Anesthesia: General | Site: Groin

## 2013-08-23 MED ORDER — OXYCODONE HCL 5 MG PO TABS: 5.0000 mg | ORAL_TABLET | Freq: Four times a day (QID) | ORAL | Status: DC | PRN

## 2013-08-23 MED ORDER — PROPOFOL 10 MG/ML IV BOLUS
INTRAVENOUS | Status: DC | PRN
  Administered 2013-08-23: 100 mg via INTRAVENOUS

## 2013-08-23 MED ORDER — NEOSTIGMINE METHYLSULFATE 1 MG/ML IJ SOLN
INTRAMUSCULAR | Status: DC | PRN
  Administered 2013-08-23: 4 mg via INTRAVENOUS

## 2013-08-23 MED ORDER — VECURONIUM BROMIDE 10 MG IV SOLR
INTRAVENOUS | Status: DC | PRN
  Administered 2013-08-23 (×2): 1 mg via INTRAVENOUS

## 2013-08-23 MED ORDER — ARTIFICIAL TEARS OP OINT
TOPICAL_OINTMENT | OPHTHALMIC | Status: DC | PRN
  Administered 2013-08-23: 1 via OPHTHALMIC

## 2013-08-23 MED ORDER — SODIUM CHLORIDE 0.9 % IR SOLN: Status: DC | PRN

## 2013-08-23 MED ORDER — DIPHENHYDRAMINE HCL 25 MG PO TABS: 25.0000 mg | ORAL_TABLET | Freq: Four times a day (QID) | ORAL | Status: DC | PRN

## 2013-08-23 MED ORDER — BUPIVACAINE-EPINEPHRINE (PF) 0.25% -1:200000 IJ SOLN
INTRAMUSCULAR | Status: AC
  Filled 2013-08-23: qty 30

## 2013-08-23 MED ORDER — CALCIUM CARBONATE ANTACID 750 MG PO CHEW: 1.0000 | CHEWABLE_TABLET | Freq: Every day | ORAL | Status: DC

## 2013-08-23 MED ORDER — NAPROXEN SODIUM 220 MG PO TABS: 220.0000 mg | ORAL_TABLET | Freq: Two times a day (BID) | ORAL | Status: DC

## 2013-08-23 MED ORDER — ACETAMINOPHEN 10 MG/ML IV SOLN
1000.0000 mg | INTRAVENOUS | Status: AC
  Administered 2013-08-23: 1000 mg via INTRAVENOUS
  Filled 2013-08-23: qty 100

## 2013-08-23 MED ORDER — LACTATED RINGERS IV SOLN
INTRAVENOUS | Status: DC | PRN
  Administered 2013-08-23 (×2): via INTRAVENOUS

## 2013-08-23 MED ORDER — 0.9 % SODIUM CHLORIDE (POUR BTL) OPTIME
TOPICAL | Status: DC | PRN
  Administered 2013-08-23: 1000 mL

## 2013-08-23 MED ORDER — LIDOCAINE HCL (CARDIAC) 20 MG/ML IV SOLN
INTRAVENOUS | Status: DC | PRN
  Administered 2013-08-23: 100 mg via INTRAVENOUS

## 2013-08-23 MED ORDER — GLYCOPYRROLATE 0.2 MG/ML IJ SOLN
INTRAMUSCULAR | Status: DC | PRN
  Administered 2013-08-23: .6 mg via INTRAVENOUS

## 2013-08-23 MED ORDER — FENTANYL CITRATE 0.05 MG/ML IJ SOLN
25.0000 ug | INTRAMUSCULAR | Status: DC | PRN
  Administered 2013-08-23 (×4): 25 ug via INTRAVENOUS

## 2013-08-23 MED ORDER — ONDANSETRON HCL 4 MG/2ML IJ SOLN
INTRAMUSCULAR | Status: AC
  Filled 2013-08-23: qty 2

## 2013-08-23 MED ORDER — ROCURONIUM BROMIDE 100 MG/10ML IV SOLN
INTRAVENOUS | Status: DC | PRN
  Administered 2013-08-23: 50 mg via INTRAVENOUS

## 2013-08-23 MED ORDER — SODIUM CHLORIDE 0.9 % IR SOLN
Status: DC | PRN
  Administered 2013-08-23: 1000 mL

## 2013-08-23 MED ORDER — EPHEDRINE SULFATE 50 MG/ML IJ SOLN
INTRAMUSCULAR | Status: DC | PRN
  Administered 2013-08-23 (×2): 10 mg via INTRAVENOUS

## 2013-08-23 MED ORDER — FENTANYL CITRATE 0.05 MG/ML IJ SOLN
INTRAMUSCULAR | Status: AC
  Filled 2013-08-23: qty 2

## 2013-08-23 MED ORDER — BUPIVACAINE-EPINEPHRINE 0.25% -1:200000 IJ SOLN
INTRAMUSCULAR | Status: DC | PRN
  Administered 2013-08-23 (×2): 30 mL

## 2013-08-23 MED ORDER — TAMSULOSIN HCL 0.4 MG PO CAPS: 0.4000 mg | ORAL_CAPSULE | Freq: Every day | ORAL | Status: DC

## 2013-08-23 MED ORDER — ONDANSETRON HCL 4 MG/2ML IJ SOLN
4.0000 mg | Freq: Once | INTRAMUSCULAR | Status: AC | PRN
  Administered 2013-08-23: 4 mg via INTRAVENOUS

## 2013-08-23 MED ORDER — FENTANYL CITRATE 0.05 MG/ML IJ SOLN
INTRAMUSCULAR | Status: DC | PRN
  Administered 2013-08-23 (×4): 50 ug via INTRAVENOUS

## 2013-08-23 MED ORDER — ONDANSETRON HCL 4 MG/2ML IJ SOLN
INTRAMUSCULAR | Status: DC | PRN
  Administered 2013-08-23: 4 mg via INTRAVENOUS

## 2013-08-23 SURGICAL SUPPLY — 47 items
APPLIER CLIP 5 13 M/L LIGAMAX5 (MISCELLANEOUS)
APR CLP MED LRG 5 ANG JAW (MISCELLANEOUS)
CANISTER SUCTION 2500CC (MISCELLANEOUS) ×1 IMPLANT
CHLORAPREP W/TINT 26ML (MISCELLANEOUS) ×3 IMPLANT
CLIP APPLIE 5 13 M/L LIGAMAX5 (MISCELLANEOUS) IMPLANT
COVER MAYO STAND STRL (DRAPES) ×1 IMPLANT
COVER SURGICAL LIGHT HANDLE (MISCELLANEOUS) ×3 IMPLANT
DEVICE SECURE STRAP 25 ABSORB (INSTRUMENTS) ×2 IMPLANT
DRAPE WARM FLUID 44X44 (DRAPE) ×3 IMPLANT
DRSG TEGADERM 2-3/8X2-3/4 SM (GAUZE/BANDAGES/DRESSINGS) ×3 IMPLANT
DRSG TEGADERM 4X4.75 (GAUZE/BANDAGES/DRESSINGS) ×3 IMPLANT
ELECT REM PT RETURN 9FT ADLT (ELECTROSURGICAL) ×3
ELECTRODE REM PT RTRN 9FT ADLT (ELECTROSURGICAL) ×2 IMPLANT
GAUZE SPONGE 2X2 8PLY STRL LF (GAUZE/BANDAGES/DRESSINGS) ×2 IMPLANT
GLOVE BIO SURGEON STRL SZ 6.5 (GLOVE) ×2 IMPLANT
GLOVE BIOGEL PI IND STRL 6.5 (GLOVE) IMPLANT
GLOVE BIOGEL PI IND STRL 7.0 (GLOVE) IMPLANT
GLOVE BIOGEL PI IND STRL 8 (GLOVE) ×2 IMPLANT
GLOVE BIOGEL PI INDICATOR 6.5 (GLOVE) ×2
GLOVE BIOGEL PI INDICATOR 7.0 (GLOVE) ×2
GLOVE BIOGEL PI INDICATOR 8 (GLOVE) ×1
GLOVE ECLIPSE 6.5 STRL STRAW (GLOVE) ×1 IMPLANT
GLOVE ECLIPSE 8.0 STRL XLNG CF (GLOVE) ×3 IMPLANT
GLOVE SURG SS PI 6.5 STRL IVOR (GLOVE) ×1 IMPLANT
GLOVE SURG SS PI 7.0 STRL IVOR (GLOVE) ×3 IMPLANT
GOWN STRL NON-REIN LRG LVL3 (GOWN DISPOSABLE) ×10 IMPLANT
GOWN STRL REIN XL XLG (GOWN DISPOSABLE) ×3 IMPLANT
KIT BASIN OR (CUSTOM PROCEDURE TRAY) ×3 IMPLANT
KIT ROOM TURNOVER OR (KITS) ×3 IMPLANT
MESH ULTRAPRO 6X6 15CM15CM (Mesh General) ×2 IMPLANT
NEEDLE 22X1 1/2 (OR ONLY) (NEEDLE) ×3 IMPLANT
NS IRRIG 1000ML POUR BTL (IV SOLUTION) ×3 IMPLANT
PAD ARMBOARD 7.5X6 YLW CONV (MISCELLANEOUS) ×7 IMPLANT
SCISSORS LAP 5X35 DISP (ENDOMECHANICALS) ×1 IMPLANT
SET IRRIG TUBING LAPAROSCOPIC (IRRIGATION / IRRIGATOR) ×1 IMPLANT
SLEEVE ENDOPATH XCEL 5M (ENDOMECHANICALS) ×5 IMPLANT
SPONGE GAUZE 2X2 STER 10/PKG (GAUZE/BANDAGES/DRESSINGS) ×1
SUT MNCRL AB 4-0 PS2 18 (SUTURE) ×3 IMPLANT
SUT VIC AB 3-0 SH 27 (SUTURE)
SUT VIC AB 3-0 SH 27XBRD (SUTURE) IMPLANT
TOWEL OR 17X24 6PK STRL BLUE (TOWEL DISPOSABLE) ×3 IMPLANT
TOWEL OR 17X26 10 PK STRL BLUE (TOWEL DISPOSABLE) ×3 IMPLANT
TRAY FOLEY CATH 16FR SILVER (SET/KITS/TRAYS/PACK) ×1 IMPLANT
TRAY LAPAROSCOPIC (CUSTOM PROCEDURE TRAY) ×3 IMPLANT
TROCAR XCEL BLUNT TIP 100MML (ENDOMECHANICALS) ×3 IMPLANT
TROCAR XCEL NON-BLD 11X100MML (ENDOMECHANICALS) ×1 IMPLANT
TROCAR XCEL NON-BLD 5MMX100MML (ENDOMECHANICALS) ×3 IMPLANT

## 2013-08-23 NOTE — Anesthesia Postprocedure Evaluation (Signed)
  Anesthesia Post-op Note  Patient: Nicholas Herring  Procedure(s) Performed: Procedure(s): LAPAROSCOPIC Bilateral INGUINAL HERNIA Repairs and TAPP block     (Bilateral) INSERTION OF MESH  (Bilateral) LAPAROSCOPIC LYSIS OF ADHESIONS (N/A)  Patient Location: PACU  Anesthesia Type:General  Level of Consciousness: awake, alert  and oriented  Airway and Oxygen Therapy: Patient Spontanous Breathing and Patient connected to nasal cannula oxygen  Post-op Pain: mild  Post-op Assessment: Post-op Vital signs reviewed  Post-op Vital Signs: Reviewed  Complications: No apparent anesthesia complications

## 2013-08-23 NOTE — Discharge Instructions (Signed)
What to eat:  For your first meals, you should eat lightly; only small meals initially.  If you do not have nausea, you may eat larger meals.  Avoid spicy, greasy and heavy food.    General Anesthesia, Adult, Care After  Refer to this sheet in the next few weeks. These instructions provide you with information on caring for yourself after your procedure. Your health care provider may also give you more specific instructions. Your treatment has been planned according to current medical practices, but problems sometimes occur. Call your health care provider if you have any problems or questions after your procedure.  WHAT TO EXPECT AFTER THE PROCEDURE  After the procedure, it is typical to experience:  Sleepiness.  Nausea and vomiting. HOME CARE INSTRUCTIONS  For the first 24 hours after general anesthesia:  Have a responsible person with you.  Do not drive a car. If you are alone, do not take public transportation.  Do not drink alcohol.  Do not take medicine that has not been prescribed by your health care provider.  Do not sign important papers or make important decisions.  You may resume a normal diet and activities as directed by your health care provider.  Change bandages (dressings) as directed.  If you have questions or problems that seem related to general anesthesia, call the hospital and ask for the anesthetist or anesthesiologist on call. SEEK MEDICAL CARE IF:  You have nausea and vomiting that continue the day after anesthesia.  You develop a rash. SEEK IMMEDIATE MEDICAL CARE IF:  You have difficulty breathing.  You have chest pain.  You have any allergic problems. Document Released: 11/03/2000 Document Revised: 03/30/2013 Document Reviewed: 02/10/2013  [facility] Patient Information 2014 [place], [place].   HERNIA REPAIR: POST OP INSTRUCTIONS  1. DIET: Follow a light bland diet the first 24 hours after arrival home, such as soup, liquids, crackers, etc.  Be sure to include  lots of fluids daily.  Avoid fast food or heavy meals as your are more likely to get nauseated.  Eat a low fat the next few days after surgery. 2. Take your usually prescribed home medications unless otherwise directed. 3. PAIN CONTROL: a. Pain is best controlled by a usual combination of three different methods TOGETHER: i. Ice/Heat ii. Over the counter pain medication iii. Prescription pain medication b. Most patients will experience some swelling and bruising around the hernia(s) such as the bellybutton, groins, or old incisions.  Ice packs or heating pads (30-60 minutes up to 6 times a day) will help. Use ice for the first few days to help decrease swelling and bruising, then switch to heat to help relax tight/sore spots and speed recovery.  Some people prefer to use ice alone, heat alone, alternating between ice & heat.  Experiment to what works for you.  Swelling and bruising can take several weeks to resolve.   c. It is helpful to take an over-the-counter pain medication regularly for the first few weeks.  Choose one of the following that works best for you: i. Naproxen (Aleve, etc)  Two 220mg  tabs twice a day ii. Ibuprofen (Advil, etc) Three 200mg  tabs four times a day (every meal & bedtime) iii. Acetaminophen (Tylenol, etc) 325-650mg  four times a day (every meal & bedtime) d. A  prescription for pain medication should be given to you upon discharge.  Take your pain medication as prescribed.  i. If you are having problems/concerns with the prescription medicine (does not control pain, nausea, vomiting, rash,  itching, etc), please call us [phone] to see if we need to switch you to a different pain medicine that will work better for you and/or control your side effect better. ii. If you need a refill on your pain medication, please contact your pharmacy.  They will contact our office to request authorization. Prescriptions will not be filled after 5 pm or on week-ends. 4. Avoid getting  constipated.  Between the surgery and the pain medications, it is common to experience some constipation.  Increasing fluid intake and taking a fiber supplement (such as Metamucil, Citrucel, FiberCon, MiraLax, etc) 1-2 times a day regularly will usually help prevent this problem from occurring.  A mild laxative (prune juice, Milk of Magnesia, MiraLax, etc) should be taken according to package directions if there are no bowel movements after 48 hours.   5. Wash / shower every day.  You may shower over the dressings as they are waterproof.   6. Remove your waterproof bandages 5 days after surgery.  You may leave the incision open to air.  You may replace a dressing/Band-Aid to cover the incision for comfort if you wish.  Continue to shower over incision(s) after the dressing is off.    7. ACTIVITIES as tolerated:   a. You may resume regular (light) daily activities beginning the next day--such as daily self-care, walking, climbing stairs--gradually increasing activities as tolerated.  If you can walk 30 minutes without difficulty, it is safe to try more intense activity such as jogging, treadmill, bicycling, low-impact aerobics, swimming, etc. b. Save the most intensive and strenuous activity for last such as sit-ups, heavy lifting, contact sports, etc  Refrain from any heavy lifting or straining until you are off narcotics for pain control.   c. DO NOT PUSH THROUGH PAIN.  Let pain be your guide: If it hurts to do something, don't do it.  Pain is your body warning you to avoid that activity for another week until the pain goes down. d. You may drive when you are no longer taking prescription pain medication, you can comfortably wear a seatbelt, and you can safely maneuver your car and apply brakes. e. Bonita QuinYou may have sexual intercourse when it is comfortable.  8. FOLLOW UP in our office a. Please call [facility] at [phone] to set up an appointment to see your surgeon in the office for a follow-up appointment  approximately 2-3 weeks after your surgery. b. Make sure that you call for this appointment the day you arrive home to insure a convenient appointment time. 9.  IF YOU HAVE DISABILITY OR FAMILY LEAVE FORMS, BRING THEM TO THE OFFICE FOR PROCESSING.  DO NOT GIVE THEM TO YOUR DOCTOR.  WHEN TO CALL US [phone]: 1. Poor pain control 2. Reactions / problems with new medications (rash/itching, nausea, etc)  3. Fever over 101.5 F (38.5 C) 4. Inability to urinate 5. Nausea and/or vomiting 6. Worsening swelling or bruising 7. Continued bleeding from incision. 8. Increased pain, redness, or drainage from the incision   The clinic staff is available to answer your questions during regular business hours (8:30am-5pm).  Please dont hesitate to call and ask to speak to one of our nurses for clinical concerns.   If you have a medical emergency, go to the nearest emergency room or call 911.  [facility] is always on call at the hospitals [facility]  [facility] [place] [address], [address], [place], [place]  [id number] ?  [address] [address], [place], [place]   [address] MAIN: [phone] ? TOLL FREE: [phone] ? FAX: [phone] [email or web address]  Managing Pain  Pain after surgery or related to activity is often due to strain/injury to muscle, tendon, nerves and/or incisions.  This pain is usually short-term and will improve in a few months.   Many people find it helpful to do the following things TOGETHER to help speed the process of healing and to get back to regular activity more quickly:  1. Avoid heavy physical activity a.  no lifting greater than 20 pounds b. Do not push through the pain.  Listen to your body and avoid positions and maneuvers than reproduce the pain c. Walking is okay as tolerated, but go slowly and stop when getting sore.  d. Remember: If it hurts to do it, then dont do it! 2. Take Anti-inflammatory  medication  a. Take with food/snack around the clock for 1-2 weeks i. This helps the muscle and nerve tissues become less irritable and calm down faster b. Choose ONE of the following over-the-counter medications: i. Naproxen 220mg  tabs (ex. Aleve) 1-2 pills twice a day  ii. Ibuprofen 200mg  tabs (ex. Advil, Motrin) 3-4 pills with every meal and just before bedtime iii. Acetaminophen 500mg  tabs (Tylenol) 1-2 pills with every meal and just before bedtime 3. Use a Heating pad or Ice/Cold Pack a. 4-6 times a day b. May use warm bath/hottub  or showers 4. Try Gentle Massage and/or Stretching  a. at the area of pain many times a day b. stop if you feel pain - do not overdo it  Try these steps together to help you body heal faster and avoid making things get worse.  Doing just one of these things may not be enough.    If you are not getting better after two weeks or are noticing you are getting worse, contact our office for further advice; we may need to re-evaluate you & see what other things we can do to help.  GETTING TO GOOD BOWEL HEALTH. Irregular bowel habits such as constipation and diarrhea can lead to many problems over time.  Having one soft bowel movement a day is the most important way to prevent further problems.  The anorectal canal is designed to handle stretching and feces to safely manage our ability to get rid of solid waste (feces, poop, stool) out of our body.  BUT, hard constipated stools can act like ripping concrete bricks and diarrhea can be a burning fire to this very sensitive area of our body, causing inflamed hemorrhoids, anal fissures, increasing risk is perirectal abscesses, abdominal pain/bloating, an making irritable bowel worse.     The goal: ONE SOFT BOWEL MOVEMENT A DAY!  To have soft, regular bowel movements:    Drink at least 8 tall glasses of water a day.     Take plenty of fiber.  Fiber is the undigested part of plant food that passes into the colon, acting s  natures broom to encourage bowel motility and movement.  Fiber can absorb and hold large amounts of water. This results in a larger, bulkier stool, which is soft and easier to pass. Work gradually over several weeks up to 6 servings a day of fiber (25g a day even more if needed) in the form of: o Vegetables -- Root (potatoes, carrots, turnips), leafy green (lettuce, salad greens, celery, spinach), or cooked high residue (cabbage, broccoli, etc) o Fruit -- Fresh (unpeeled skin & pulp), Dried (prunes, apricots,  cherries, etc ),  or stewed ( applesauce)  o Whole grain breads, pasta, etc (whole wheat)  o Bran cereals    Bulking Agents -- This type of water-retaining fiber generally is easily obtained each day by one of the following:  o Psyllium bran -- The psyllium plant is remarkable because its ground seeds can retain so much water. This product is available as Metamucil, Konsyl, Effersyllium, Per Diem Fiber, or the less expensive generic preparation in drug and health food stores. Although labeled a laxative, it really is not a laxative.  o Methylcellulose -- This is another fiber derived from wood which also retains water. It is available as Citrucel. o Polyethylene Glycol - and artificial fiber commonly called Miralax or Glycolax.  It is helpful for people with gassy or bloated feelings with regular fiber o Flax Seed - a less gassy fiber than psyllium   No reading or other relaxing activity while on the toilet. If bowel movements take longer than 5 minutes, you are too constipated   AVOID CONSTIPATION.  High fiber and water intake usually takes care of this.  Sometimes a laxative is needed to stimulate more frequent bowel movements, but    Laxatives are not a good long-term solution as it can wear the colon out. o Osmotics (Milk of Magnesia, Fleets phosphosoda, Magnesium citrate, MiraLax, GoLytely) are safer than  o Stimulants (Senokot, Castor Oil, Dulcolax, Ex Lax)    o Do not take laxatives  for more than 7days in a row.    IF SEVERELY CONSTIPATED, try a Bowel Retraining Program: o Do not use laxatives.  o Eat a diet high in roughage, such as bran cereals and leafy vegetables.  o Drink six (6) ounces of prune or apricot juice each morning.  o Eat two (2) large servings of stewed fruit each day.  o Take one (1) heaping tablespoon of a psyllium-based bulking agent twice a day. Use sugar-free sweetener when possible to avoid excessive calories.  o Eat a normal breakfast.  o Set aside 15 minutes after breakfast to sit on the toilet, but do not strain to have a bowel movement.  o If you do not have a bowel movement by the third day, use an enema and repeat the above steps.    Controlling diarrhea o Switch to liquids and simpler foods for a few days to avoid stressing your intestines further. o Avoid dairy products (especially milk & ice cream) for a short time.  The intestines often can lose the ability to digest lactose when stressed. o Avoid foods that cause gassiness or bloating.  Typical foods include beans and other legumes, cabbage, broccoli, and dairy foods.  Every person has some sensitivity to other foods, so listen to our body and avoid those foods that trigger problems for you. o Adding fiber (Citrucel, Metamucil, psyllium, Miralax) gradually can help thicken stools by absorbing excess fluid and retrain the intestines to act more normally.  Slowly increase the dose over a few weeks.  Too much fiber too soon can backfire and cause cramping & bloating. o Probiotics (such as active yogurt, Align, etc) may help repopulate the intestines and colon with normal bacteria and calm down a sensitive digestive tract.  Most studies show it to be of mild help, though, and such products can be costly. o Medicines:   Bismuth subsalicylate (ex. Kayopectate, Pepto Bismol) every 30 minutes for up to 6 doses can help control diarrhea.  Avoid if pregnant.   Loperamide (Immodium) can  slow down  diarrhea.  Start with two tablets (4mg  total) first and then try one tablet every 6 hours.  Avoid if you are having fevers or severe pain.  If you are not better or start feeling worse, stop all medicines and call your doctor for advice o Call your doctor if you are getting worse or not better.  Sometimes further testing (cultures, endoscopy, X-ray studies, bloodwork, etc) may be needed to help diagnose and treat the cause of the diarrhea.   Hernia A hernia occurs when an internal organ pushes out through a weak spot in the abdominal wall. Hernias most commonly occur in the groin and around the navel. Hernias often can be pushed back into place (reduced). Most hernias tend to get worse over time. Some abdominal hernias can get stuck in the opening (irreducible or incarcerated hernia) and cannot be reduced. An irreducible abdominal hernia which is tightly squeezed into the opening is at risk for impaired blood supply (strangulated hernia). A strangulated hernia is a medical emergency. Because of the risk for an irreducible or strangulated hernia, surgery may be recommended to repair a hernia. CAUSES   Heavy lifting.  Prolonged coughing.  Straining to have a bowel movement.  A cut (incision) made during an abdominal surgery. HOME CARE INSTRUCTIONS   Bed rest is not required. You may continue your normal activities.  Avoid lifting more than 10 pounds (4.5 kg) or straining.  Cough gently. If you are a smoker it is best to stop. Even the best hernia repair can break down with the continual strain of coughing. Even if you do not have your hernia repaired, a cough will continue to aggravate the problem.  Do not wear anything tight over your hernia. Do not try to keep it in with an outside bandage or truss. These can damage abdominal contents if they are trapped within the hernia sac.  Eat a normal diet.  Avoid constipation. Straining over long periods of time will increase hernia size and  encourage breakdown of repairs. If you cannot do this with diet alone, stool softeners may be used. SEEK IMMEDIATE MEDICAL CARE IF:   You have a fever.  You develop increasing abdominal pain.  You feel nauseous or vomit.  Your hernia is stuck outside the abdomen, looks discolored, feels hard, or is tender.  You have any changes in your bowel habits or in the hernia that are unusual for you.  You have increased pain or swelling around the hernia.  You cannot push the hernia back in place by applying gentle pressure while lying down. MAKE SURE YOU:   Understand these instructions.  Will watch your condition.  Will get help right away if you are not doing well or get worse. Document Released: 07/28/2005 Document Revised: 10/20/2011 Document Reviewed: 03/16/2008 [facility] Patient Information 2014 [place], [place].

## 2013-08-23 NOTE — Transfer of Care (Signed)
Immediate Anesthesia Transfer of Care Note  Patient: Lucille PassyJackson B Anzaldo  Procedure(s) Performed: Procedure(s): LAPAROSCOPIC Bilateral INGUINAL HERNIA Repairs and TAPP block     (Bilateral) INSERTION OF MESH  (Bilateral) LAPAROSCOPIC LYSIS OF ADHESIONS (N/A)  Patient Location: PACU  Anesthesia Type:General  Level of Consciousness: awake and patient cooperative  Airway & Oxygen Therapy: Patient Spontanous Breathing and Patient connected to nasal cannula oxygen  Post-op Assessment: Report given to PACU RN  Post vital signs: Reviewed and stable  Complications: No apparent anesthesia complications

## 2013-08-23 NOTE — Anesthesia Preprocedure Evaluation (Addendum)
Anesthesia Evaluation  Patient identified by MRN, date of birth, ID band Patient awake    Reviewed: Allergy & Precautions, H&P , NPO status , Patient's Chart, lab work & pertinent test results  Airway Mallampati: I TM Distance: >3 FB Neck ROM: Full    Dental  (+) Teeth Intact and Dental Advisory Given   Pulmonary sleep apnea (Was formally diagnosed "years ago" and used CPAP for some years but discontinued it on his own.) , former smoker,  breath sounds clear to auscultation        Cardiovascular Rhythm:Regular Rate:Normal     Neuro/Psych    GI/Hepatic   Endo/Other    Renal/GU      Musculoskeletal   Abdominal   Peds  Hematology   Anesthesia Other Findings   Reproductive/Obstetrics                          Anesthesia Physical Anesthesia Plan  ASA: II  Anesthesia Plan: General   Post-op Pain Management:    Induction: Intravenous  Airway Management Planned: Oral ETT  Additional Equipment:   Intra-op Plan:   Post-operative Plan: Extubation in OR  Informed Consent: I have reviewed the patients History and Physical, chart, labs and discussed the procedure including the risks, benefits and alternatives for the proposed anesthesia with the patient or authorized representative who has indicated his/her understanding and acceptance.   Dental advisory given  Plan Discussed with: CRNA, Anesthesiologist and Surgeon  Anesthesia Plan Comments:         Anesthesia Quick Evaluation

## 2013-08-23 NOTE — H&P (View-Only) (Signed)
Subjective:     Patient ID: Nicholas Herring, male   DOB: 08-Sep-1926, 80 y.o.   MRN: [id number]  HPI  Note: This dictation was prepared with Dragon/digital dictation along with [facility] technology. Any transcriptional errors that result from this process are unintentional.       LAURENCE CROFFORD  May 02, 1927 [id number]  Patient Care Team: Jonita Albee, MD as PCP - General (Internal Medicine) Griffith Citron, MD as Consulting Physician (Gastroenterology) Lunette Stands, MD as Consulting Physician (Orthopedic Surgery)  This patient is a 80 y.o.male who presents today for surgical evaluation at the request of Dr. Linwood Dibbles, [facility] ED.   Reason for visit: Intermittent right groin bulging with pain/discomfort.  Concern for recurrent hernia.  Pleasant elderly gentleman who tends to avoid doctors.  Used to be very active.  Worsening arthritis of knees has limited some mobility.  He had a hernia repair of his right groin as a child.  Found to have an incarcerated periumbilical ventral hernia 5 years ago.  It took some time sorting out his history, but eventually I found out he actually had surgery with [place].  Initially planned to have surgery Dr. Zachery Dakins.  Lost to followup.  He came in urgently.  Had to have surgery done by Dr. Ezzard Standing April 2009.  Eventually recovered from that.  He is still rather active.  A few weeks ago he noticed a painful lump in his right groin.  If he lies down it can reduce.  It recurred.  He went to the emergency room.  Suspicion but no definite diagnosis of hernia.  Surgical consultation recommended.  The patient notes that he can be quite uncomfortable when it is out.  He pretty much as to stay in bed until it can massaged back in.  This concerns him.  He wishes to be aggressive and have something done.  Therefore, he comes to the office considering surgery.  Patient Active Problem List   Diagnosis Date Noted  . Hiatal hernia 08/17/2013  . Osteoarthritis of both knees  08/17/2013  . Recurrent right inguinal hernia 08/17/2013  . Inguinal hernia, left 08/17/2013  . Difficulty in urination     Past Medical History  Diagnosis Date  . Incarcerated incisional hernia 2009  . Difficulty in urination   . Macular degeneration   . Colon polyps   . Seborrheic keratoses, inflamed 2013  . Diverticulosis of sigmoid colon   . Osteoarthritis of both knees 08/17/2013  . Acute urinary retention 2009    Resolved after foley & flomax    Past Surgical History  Procedure Laterality Date  . Cholecystectomy  1987  . Laparoscopic assisted ventral hernia repair  2009    Dr Ezzard Standing  . Stomach surgery  1990    reflux surgery?  . Inguinal hernia repair Right infant    History   Social History  . Marital Status: Married    Spouse Name: N/A    Number of Children: N/A  . Years of Education: N/A   Occupational History  . Retired   . Lawyer/Judge    Social History Main Topics  . Smoking status: Former Smoker    Quit date: 08/12/1971  . Smokeless tobacco: Never Used  . Alcohol Use: 3.6 oz/week    6 Cans of beer per week     Comment: weekly  . Drug Use: No  . Sexual Activity: Not on file   Other Topics Concern  . Not on file   Social History Narrative  Lives with his wife.  [employer], class of '52.    History reviewed. No pertinent family history.  Current Outpatient Prescriptions  Medication Sig Dispense Refill  . calcium carbonate (TUMS EX) 750 MG chewable tablet Chew 1 tablet by mouth daily.      . diphenhydrAMINE (BENADRYL) 25 MG tablet Take 25 mg by mouth every 6 (six) hours as needed.      . naproxen sodium (ANAPROX) 220 MG tablet Take 220 mg by mouth 2 (two) times daily with a meal.      . tamsulosin (FLOMAX) 0.4 MG CAPS capsule Take 1 capsule (0.4 mg total) by mouth daily.  30 capsule  1   No current facility-administered medications for this visit.     Allergies  Allergen Reactions  . Morphine And Related Nausea And  Vomiting    BP 102/62  Pulse 72  Temp(Src) 97.3 F (36.3 C) (Temporal)  Resp 14  Ht 6' (1.829 m)  Wt 181 lb 6.4 oz (82.283 kg)  BMI 24.60 kg/m2  No results found.   Review of Systems  Constitutional: Negative for fever, chills and diaphoresis.  HENT: Negative for ear discharge, facial swelling, mouth sores, nosebleeds, sore throat and trouble swallowing.   Eyes: Negative for photophobia, discharge and visual disturbance.  Respiratory: Negative for choking, chest tightness, shortness of breath and stridor.   Cardiovascular: Negative for chest pain and palpitations.  Gastrointestinal: Negative for nausea, vomiting, abdominal pain, diarrhea, constipation, blood in stool, abdominal distention, anal bleeding and rectal pain.  Endocrine: Negative for cold intolerance and heat intolerance.  Genitourinary: Negative for dysuria, urgency, difficulty urinating and testicular pain.  Musculoskeletal: Negative for arthralgias, back pain, gait problem and myalgias.  Skin: Negative for color change, pallor, rash and wound.  Allergic/Immunologic: Negative for environmental allergies and food allergies.  Neurological: Negative for dizziness, speech difficulty, weakness, numbness and headaches.  Hematological: Negative for adenopathy. Does not bruise/bleed easily.  Psychiatric/Behavioral: Negative for hallucinations, confusion and agitation.       Objective:   Physical Exam  Constitutional: He is oriented to person, place, and time. He appears well-developed and well-nourished. He is cooperative. No distress.  HENT:  Head: Normocephalic.  Right Ear: External ear normal. No drainage. Decreased hearing is noted.  Left Ear: External ear normal. No drainage. Decreased hearing is noted.  Mouth/Throat: Oropharynx is clear and moist. No oropharyngeal exudate.  Eyes: Conjunctivae and EOM are normal. Pupils are equal, round, and reactive to light. No scleral icterus.  Neck: Normal range of motion. Neck  supple. No tracheal deviation present.  Cardiovascular: Normal rate, regular rhythm and intact distal pulses.   Pulmonary/Chest: Effort normal and breath sounds normal. No respiratory distress.  Abdominal: Soft. He exhibits no distension. There is no tenderness. There is no rigidity, no rebound, no guarding, no CVA tenderness, no tenderness at McBurney's point and negative Murphy's sign. A hernia is present. Hernia confirmed positive in the right inguinal area and confirmed positive in the left inguinal area. Hernia confirmed negative in the ventral area.    Musculoskeletal: Normal range of motion. He exhibits no tenderness.  Lymphadenopathy:    He has no cervical adenopathy.       Right: No inguinal adenopathy present.       Left: No inguinal adenopathy present.  Neurological: He is alert and oriented to person, place, and time. No cranial nerve deficit. He exhibits normal muscle tone. Coordination normal.  Skin: Skin is warm and dry. No rash noted. He is  not diaphoretic. No erythema. No pallor.  Psychiatric: He has a normal mood and affect. Judgment and thought content normal. His speech is delayed. He is slowed. Cognition and memory are normal.       Assessment:     Recurrent right and probable left new inguinal hernias     Plan:     I offered observation vs. Surgery.  He notes that the hernia is popping out more often and becoming much more comfortable.  He has a pretty good quality of life and is functional.  He wishes to be aggressive and have this repaired.  He had prior hernia surgery.  On 5 years ago by my partner, Dr. Ezzard StandingNewman.  I mentioned this to him.  He felt comfortable having myself proceed with the surgery.  He would require a TAPP repair as opposed to a preperitoneal TEP type repair.  Possible open operation as backup plan.  I discussed with him:  The anatomy & physiology of the abdominal wall and pelvic floor was discussed.  The pathophysiology of hernias in the inguinal and  pelvic region was discussed.  Natural history risks such as progressive enlargement, pain, incarceration & strangulation was discussed.   Contributors to complications such as smoking, obesity, diabetes, prior surgery, etc were discussed.    I feel the risks of no intervention will lead to serious problems that outweigh the operative risks; therefore, I recommended surgery to reduce and repair the hernia.  I explained laparoscopic techniques with possible need for an open approach.  I noted usual use of mesh to patch and/or buttress hernia repair  Risks such as bleeding, infection, abscess, need for further treatment, heart attack, death, and other risks were discussed.  I noted a good likelihood this will help address the problem.   Goals of post-operative recovery were discussed as well.  Possibility that this will not correct all symptoms was explained.  I stressed the importance of low-impact activity, aggressive pain control, avoiding constipation, & not pushing through pain to minimize risk of post-operative chronic pain or injury. Possibility of reherniation was discussed.  We will work to minimize complications.     An educational handout further explaining the pathology & treatment options was given as well.  Questions were answered.  The patient expresses understanding & wishes to proceed with surgery.  With his issues of prior urinary retention with less surgery, I recommend he start Flomax up to minimize the chance of urinary retention.  If he has persistent issues, reconsider seeing oyology for this.  He tends to avoid doctors and does not want to do that.  I did not insist.

## 2013-08-23 NOTE — Anesthesia Procedure Notes (Signed)
Procedure Name: Intubation Date/Time: 08/23/2013 11:02 AM Performed by: Jefm MilesENNIE, Nicholas Herring Pre-anesthesia Checklist: Patient identified, Emergency Drugs available, Suction available, Patient being monitored and Timeout performed Patient Re-evaluated:Patient Re-evaluated prior to inductionOxygen Delivery Method: Circle system utilized Preoxygenation: Pre-oxygenation with 100% oxygen Intubation Type: IV induction Ventilation: Oral airway inserted - appropriate to patient size and Mask ventilation without difficulty Laryngoscope Size: 3 and Mac Grade View: Grade II Tube type: Oral Tube size: 7.5 mm Number of attempts: 1 Airway Equipment and Method: Stylet Placement Confirmation: ETT inserted through vocal cords under direct vision,  positive ETCO2 and breath sounds checked- equal and bilateral Secured at: 23 cm Tube secured with: Tape Dental Injury: Teeth and Oropharynx as per pre-operative assessment

## 2013-08-23 NOTE — Preoperative (Signed)
Beta Blockers   Reason not to administer Beta Blockers:Not Applicable 

## 2013-08-23 NOTE — Interval H&P Note (Signed)
History and Physical Interval Note:  08/23/2013 10:26 AM  Nicholas PassyJackson B Groman  has presented today for surgery, with the diagnosis of  Recurrent right inguinal and probable new left inguinal hernias   The various methods of treatment have been discussed with the patient and family. After consideration of risks, benefits and other options for treatment, the patient has consented to  Procedure(s): LAPAROSCOPIC INGUINAL HERNIA Laparoscopic exploration & repair of hernias in right (possibly left) groins (TAPP)      (Right) INSERTION OF MESH/Mesh:  15x15cm Ethicon Ultrapro mesh x2 available in room      (Right) as a surgical intervention .  The patient's history has been reviewed, patient examined, no change in status, stable for surgery.  I have reviewed the patient's chart and labs.  Questions were answered to the patient's satisfaction.     [name] C.

## 2013-08-23 NOTE — Op Note (Signed)
08/23/2013  1:25 PM  PATIENT:  Nicholas Herring  80 y.o. male  Patient Care Team: Jonita Albeehris W Guest, MD as PCP - General (Internal Medicine) Griffith CitronJeffrey R Medoff, MD as Consulting Physician (Gastroenterology) Lunette StandsAnna Voytek, MD as Consulting Physician (Orthopedic Surgery)  PRE-OPERATIVE DIAGNOSIS:   Recurrent right inguinal and probable new left inguinal hernias   POST-OPERATIVE DIAGNOSIS:   Recurrent bilateral inguinal inguinal hernias  PROCEDURE:  Procedure(s): LAPAROSCOPIC Bilateral INGUINAL HERNIA Repairs (TAPP approach) INSERTION OF MESH  LAPAROSCOPIC LYSIS OF ADHESIONS  SURGEON:  Surgeon(s): Ardeth SportsmanSteven C. Mackinsey Pelland, MD  ASSISTANT: RNFA   ANESTHESIA:   local and general  EBL:  Total I/O In: 1000 [I.V.:1000] Out: 200 [Urine:200]  Delay start of Pharmacological VTE agent (>24hrs) due to surgical blood loss or risk of bleeding:  no  DRAINS: none   SPECIMEN:  No Specimen  DISPOSITION OF SPECIMEN:  N/A  COUNTS:  YES  PLAN OF CARE: Discharge to home after PACU  PATIENT DISPOSITION:  PACU - hemodynamically stable.  INDICATION: Pleasant elderly gentleman status post prior laparoscopic repair of incarcerated periumbilical ventral hernia.  As developed at least a right probable left recurrent inguinal hernias with symptoms.  He wished to be aggressive and have them explored & repaired.  The anatomy & physiology of the abdominal wall and pelvic floor was discussed.  The pathophysiology of hernias in the inguinal and pelvic region was discussed.  Natural history risks such as progressive enlargement, pain, incarceration & strangulation was discussed.   Contributors to complications such as smoking, obesity, diabetes, prior surgery, etc were discussed.    I feel the risks of no intervention will lead to serious problems that outweigh the operative risks; therefore, I recommended surgery to reduce and repair the hernia.  I explained laparoscopic techniques with possible need for an open approach.   I noted usual use of mesh to patch and/or buttress hernia repair  Risks such as bleeding, infection, abscess, need for further treatment, heart attack, death, and other risks were discussed.  I noted a good likelihood this will help address the problem.   Goals of post-operative recovery were discussed as well.  Possibility that this will not correct all symptoms was explained.  I stressed the importance of low-impact activity, aggressive pain control, avoiding constipation, & not pushing through pain to minimize risk of post-operative chronic pain or injury. Possibility of reherniation was discussed.  We will work to minimize complications.     An educational handout further explaining the pathology & treatment options was given as well.  Questions were answered.  The patient expresses understanding & wishes to proceed with surgery.  OR FINDINGS: He had recurrent bilateral inguinal hernias.  The right was indirect was small direct space defect as well.  Pantaloon type.  On the left, small indirect recurrence.  No evidence of femoral nor obturator hernias  DESCRIPTION:   The patient was identified & brought into the operating room. The patient was positioned supine with arms tucked. SCDs were active during the entire case. The patient underwent general anesthesia without any difficulty.  The abdomen was prepped and draped in a sterile fashion. The patient's bladder was emptied.  A Surgical Timeout confirmed our plan.  The patient was positioned in reverse Trendelenburg.  Abdominal entry with a 5mm laparoscopic port was gained using optical entry technique in the left upper abdomen.  Entry was clean.  I induced carbon dioxide insufflation.  Camera inspection revealed no injury.  Extra ports were carefully placed under direct  laparoscopic visualization.  Entry was clean.  We induced carbon dioxide insufflation. Camera inspection revealed no injury.    I placed a 5 mm port in the left flank.  The  laparoscopic lysis adhesions to free omental adhesions to the old mesh as well as and lower abdomen.  I freed them up to the liver edge.  I then placed a 5 mm port in the right midabdomen.  I placed a 10 mm port and through the prior Parietex mesh supraumbilically.  I scored the peritoneum just inferior to the mesh infraumbilically and in a transverse fashion to both iliac crests.  I then used blunt and focused cautery scissors dissection to free the peritoneum off bilateral lower quadrants.  It did encounter inferior epigastric vessels and provided hemostasis there.  I focused attention on the right side since that was the dominant hernia side.   I used blunt & focused sharp dissection to free the peritoneum off the flank and down to the pubic rim.  I freed the anteriolateral bladder wall off the anteriolateral pelvic wall, sparing midline attachments.   I located a swath of peritoneum going into a hernia fascial defect at the direct space and a dilated internal ring consistent with a Combined pantaloon-type direct/indirect inguinal hernia.  I gradually freed the peritoneal hernia sac off safely and reduced it into the preperitoneal space.  I freed the peritoneum off the spermatic vessels & vas deferens.  I freed peritoneum off the retroperitoneum along the psoas muscle.    I checked & assured hemostasis.    I turned attention on the opposite side.  I did dissection in a similar, mirror-image fashion. The patient had a MR a subtle indirect hernia.  I chose 15x15 cm sheets of ultra-lightweight polypropylene mesh (Ultrapro), one for each side.  I cut a single sigmoid-shaped slit ~6cm from a corner of each mesh.  I placed the meshes into the preperitoneal space & laid them as overlapping diamonds such that at the inferior points, a 6x6 cm corner flap rested in the true anterolateral pelvis, covering the obturator & femoral foramina.   I allowed the bladder to fall back and help tuck the corners of the mesh in.   The medial corners overlapped each other across midline cephalad to the pubic rim.   This provided >2 inch coverage around the hernia.   I did place some absorbable Secure-strap tacks in the upper outer quarters in the midline to help hold the meshes in place.   I elevated the peritoneum the intra-abdominal wall.  I tacked it back to the anterior abdominal wall using absorbable suture strap tacker.  I secured it at the corners and came up centrally.  There were some small peritoneal defects that I was able to close over themselves using the hernia sacs a good result.  This tacked the mesh in place as well. I evacuated carbon dioxide.  I closed the supraumbilical 7mm fascia defect with 0 vicryl absorbable suture.  I closed the skin using 4-0 monocryl stitch.  Sterile dressings were applied. The patient was extubated & arrived in the PACU in stable condition..  I had discussed postoperative care with the patient & his wife in the holding area.   I am about to discuss operative findings and postoperative goals / instructions to family as well.  Instructions are written in the chart.  I recommend the patient to leave Foley catheter in for 3 days and then remove to decrease her risk of urinary retention  since he was unable to get his Flomax started until last night And he has a history of urinary retention with prior operations.  He hinted that he may not want to do this.  We will see how it goes.

## 2013-08-25 ENCOUNTER — Inpatient Hospital Stay (HOSPITAL_COMMUNITY)
Admission: EM | Admit: 2013-08-25 | Discharge: 2013-08-31 | DRG: 381 | Disposition: A | Discharge status: Home / Self Care | Payer: Medicare Other | Attending: Surgery | Admitting: Surgery

## 2013-08-25 ENCOUNTER — Encounter (HOSPITAL_COMMUNITY): Payer: Self-pay | Admitting: Surgery

## 2013-08-25 ENCOUNTER — Emergency Department (HOSPITAL_COMMUNITY): Payer: Medicare Other

## 2013-08-25 ENCOUNTER — Telehealth (INDEPENDENT_AMBULATORY_CARE_PROVIDER_SITE_OTHER): Payer: Self-pay | Admitting: *Deleted

## 2013-08-25 DIAGNOSIS — K209 Esophagitis, unspecified without bleeding: Secondary | ICD-10-CM

## 2013-08-25 DIAGNOSIS — K567 Ileus, unspecified: Secondary | ICD-10-CM

## 2013-08-25 DIAGNOSIS — Z87891 Personal history of nicotine dependence: Secondary | ICD-10-CM

## 2013-08-25 DIAGNOSIS — Z9889 Other specified postprocedural states: Secondary | ICD-10-CM

## 2013-08-25 DIAGNOSIS — K921 Melena: Secondary | ICD-10-CM | POA: Diagnosis present

## 2013-08-25 DIAGNOSIS — F0390 Unspecified dementia without behavioral disturbance: Secondary | ICD-10-CM | POA: Diagnosis present

## 2013-08-25 DIAGNOSIS — K2211 Ulcer of esophagus with bleeding: Principal | ICD-10-CM | POA: Diagnosis present

## 2013-08-25 DIAGNOSIS — R339 Retention of urine, unspecified: Secondary | ICD-10-CM | POA: Diagnosis present

## 2013-08-25 DIAGNOSIS — K4021 Bilateral inguinal hernia, without obstruction or gangrene, recurrent: Secondary | ICD-10-CM | POA: Diagnosis present

## 2013-08-25 DIAGNOSIS — M1710 Unilateral primary osteoarthritis, unspecified knee: Secondary | ICD-10-CM | POA: Diagnosis present

## 2013-08-25 DIAGNOSIS — F29 Unspecified psychosis not due to a substance or known physiological condition: Secondary | ICD-10-CM | POA: Diagnosis not present

## 2013-08-25 DIAGNOSIS — N401 Enlarged prostate with lower urinary tract symptoms: Secondary | ICD-10-CM | POA: Diagnosis present

## 2013-08-25 DIAGNOSIS — K929 Disease of digestive system, unspecified: Secondary | ICD-10-CM | POA: Diagnosis present

## 2013-08-25 DIAGNOSIS — N138 Other obstructive and reflux uropathy: Secondary | ICD-10-CM | POA: Diagnosis present

## 2013-08-25 DIAGNOSIS — K56609 Unspecified intestinal obstruction, unspecified as to partial versus complete obstruction: Secondary | ICD-10-CM | POA: Diagnosis present

## 2013-08-25 DIAGNOSIS — K9189 Other postprocedural complications and disorders of digestive system: Secondary | ICD-10-CM

## 2013-08-25 DIAGNOSIS — K920 Hematemesis: Secondary | ICD-10-CM | POA: Diagnosis present

## 2013-08-25 DIAGNOSIS — K560 Paralytic ileus: Secondary | ICD-10-CM | POA: Diagnosis present

## 2013-08-25 DIAGNOSIS — H3530 Unspecified macular degeneration: Secondary | ICD-10-CM | POA: Diagnosis present

## 2013-08-25 DIAGNOSIS — Z79899 Other long term (current) drug therapy: Secondary | ICD-10-CM

## 2013-08-25 DIAGNOSIS — R39198 Other difficulties with micturition: Secondary | ICD-10-CM

## 2013-08-25 DIAGNOSIS — K208 Other esophagitis without bleeding: Secondary | ICD-10-CM | POA: Diagnosis present

## 2013-08-25 DIAGNOSIS — Z885 Allergy status to narcotic agent status: Secondary | ICD-10-CM

## 2013-08-25 DIAGNOSIS — G4730 Sleep apnea, unspecified: Secondary | ICD-10-CM | POA: Diagnosis present

## 2013-08-25 LAB — URINALYSIS, ROUTINE W REFLEX MICROSCOPIC
GLUCOSE, UA: NEGATIVE mg/dL
Ketones, ur: >80 mg/dL — AB
Nitrite: NEGATIVE
PH: 6 (ref 5.0–8.0)
Protein, ur: 30 mg/dL — AB
SPECIFIC GRAVITY, URINE: 1.025 (ref 1.005–1.030)
Urobilinogen, UA: 1 mg/dL (ref 0.0–1.0)

## 2013-08-25 LAB — CBC WITH DIFFERENTIAL/PLATELET
BASOS ABS: 0 10*3/uL (ref 0.0–0.1)
Basophils Relative: 0 % (ref 0–1)
EOS ABS: 0 10*3/uL (ref 0.0–0.7)
Eosinophils Relative: 0 % (ref 0–5)
HCT: 37.8 % — ABNORMAL LOW (ref 39.0–52.0)
Hemoglobin: 12.7 g/dL — ABNORMAL LOW (ref 13.0–17.0)
LYMPHS PCT: 8 % — AB (ref 12–46)
Lymphs Abs: 0.9 10*3/uL (ref 0.7–4.0)
MCH: 29.6 pg (ref 26.0–34.0)
MCHC: 33.6 g/dL (ref 30.0–36.0)
MCV: 88.1 fL (ref 78.0–100.0)
Monocytes Absolute: 0.9 10*3/uL (ref 0.1–1.0)
Monocytes Relative: 8 % (ref 3–12)
Neutro Abs: 9.6 10*3/uL — ABNORMAL HIGH (ref 1.7–7.7)
Neutrophils Relative %: 84 % — ABNORMAL HIGH (ref 43–77)
PLATELETS: 250 10*3/uL (ref 150–400)
RBC: 4.29 MIL/uL (ref 4.22–5.81)
RDW: 14.7 % (ref 11.5–15.5)
WBC: 11.4 10*3/uL — AB (ref 4.0–10.5)

## 2013-08-25 LAB — URINE MICROSCOPIC-ADD ON

## 2013-08-25 LAB — LIPASE, BLOOD: Lipase: 10 U/L — ABNORMAL LOW (ref 11–59)

## 2013-08-25 LAB — OCCULT BLOOD GASTRIC / DUODENUM (SPECIMEN CUP)
Occult Blood, Gastric: POSITIVE — AB
PH, GASTRIC: 4

## 2013-08-25 LAB — COMPREHENSIVE METABOLIC PANEL
ALT: 12 U/L (ref 0–53)
AST: 22 U/L (ref 0–37)
Albumin: 3.4 g/dL — ABNORMAL LOW (ref 3.5–5.2)
Alkaline Phosphatase: 139 U/L — ABNORMAL HIGH (ref 39–117)
BUN: 15 mg/dL (ref 6–23)
CALCIUM: 9.3 mg/dL (ref 8.4–10.5)
CO2: 26 mEq/L (ref 19–32)
Chloride: 95 mEq/L — ABNORMAL LOW (ref 96–112)
Creatinine, Ser: 0.77 mg/dL (ref 0.50–1.35)
GFR calc Af Amer: >90 mL/min (ref >90)
GFR calc non Af Amer: 81 mL/min — ABNORMAL LOW (ref >90)
Glucose, Bld: 174 mg/dL — ABNORMAL HIGH (ref 70–99)
Potassium: 4.1 mEq/L (ref 3.7–5.3)
SODIUM: 136 meq/L — AB (ref 137–147)
TOTAL PROTEIN: 7.1 g/dL (ref 6.0–8.3)
Total Bilirubin: 0.7 mg/dL (ref 0.3–1.2)

## 2013-08-25 LAB — CG4 I-STAT (LACTIC ACID): Lactic Acid, Venous: 1.5 mmol/L (ref 0.5–2.2)

## 2013-08-25 MED ORDER — PHENOL 1.4 % MT LIQD: 2.0000 | OROMUCOSAL | Status: DC | PRN

## 2013-08-25 MED ORDER — TAMSULOSIN HCL 0.4 MG PO CAPS
0.4000 mg | ORAL_CAPSULE | Freq: Every day | ORAL | Status: DC
  Administered 2013-08-25 – 2013-08-29 (×5): 0.4 mg via ORAL
  Filled 2013-08-25 (×6): qty 1

## 2013-08-25 MED ORDER — LACTATED RINGERS IV BOLUS (SEPSIS): 1000.0000 mL | Freq: Three times a day (TID) | INTRAVENOUS | Status: AC | PRN

## 2013-08-25 MED ORDER — PANTOPRAZOLE SODIUM 40 MG IV SOLR: 40.0000 mg | Freq: Two times a day (BID) | INTRAVENOUS | Status: DC

## 2013-08-25 MED ORDER — LIP MEDEX EX OINT
1.0000 "application " | TOPICAL_OINTMENT | Freq: Two times a day (BID) | CUTANEOUS | Status: DC
  Administered 2013-08-25 – 2013-08-31 (×13): 1 via TOPICAL
  Filled 2013-08-25 (×3): qty 7

## 2013-08-25 MED ORDER — HYDROMORPHONE HCL PF 1 MG/ML IJ SOLN
0.5000 mg | INTRAMUSCULAR | Status: DC | PRN
  Administered 2013-08-28: 1 mg via INTRAVENOUS
  Filled 2013-08-25: qty 1

## 2013-08-25 MED ORDER — KCL IN DEXTROSE-NACL 40-5-0.45 MEQ/L-%-% IV SOLN
INTRAVENOUS | Status: DC
  Administered 2013-08-25 – 2013-08-29 (×5): via INTRAVENOUS
  Filled 2013-08-25 (×9): qty 1000

## 2013-08-25 MED ORDER — ONDANSETRON HCL 4 MG/2ML IJ SOLN
4.0000 mg | Freq: Four times a day (QID) | INTRAMUSCULAR | Status: DC | PRN
  Administered 2013-08-25 – 2013-08-27 (×4): 4 mg via INTRAVENOUS
  Filled 2013-08-25 (×4): qty 2

## 2013-08-25 MED ORDER — FLEET ENEMA 7-19 GM/118ML RE ENEM
1.0000 | ENEMA | Freq: Once | RECTAL | Status: AC
  Administered 2013-08-25: 1 via RECTAL
  Filled 2013-08-25: qty 1

## 2013-08-25 MED ORDER — PANTOPRAZOLE SODIUM 40 MG IV SOLR
40.0000 mg | Freq: Once | INTRAVENOUS | Status: AC
  Administered 2013-08-25: 40 mg via INTRAVENOUS
  Filled 2013-08-25: qty 40

## 2013-08-25 MED ORDER — TRAMADOL HCL 50 MG PO TABS: 50.0000 mg | ORAL_TABLET | Freq: Four times a day (QID) | ORAL | Status: DC | PRN

## 2013-08-25 MED ORDER — DIPHENHYDRAMINE HCL 25 MG PO TABS
25.0000 mg | ORAL_TABLET | Freq: Four times a day (QID) | ORAL | Status: DC | PRN
  Filled 2013-08-25: qty 1

## 2013-08-25 MED ORDER — ACETAMINOPHEN 650 MG RE SUPP: 650.0000 mg | Freq: Four times a day (QID) | RECTAL | Status: DC | PRN

## 2013-08-25 MED ORDER — DIPHENHYDRAMINE HCL 12.5 MG/5ML PO ELIX
12.5000 mg | ORAL_SOLUTION | Freq: Four times a day (QID) | ORAL | Status: DC | PRN
  Administered 2013-08-27: 25 mg via ORAL
  Filled 2013-08-25: qty 10

## 2013-08-25 MED ORDER — ACETAMINOPHEN 325 MG PO TABS
325.0000 mg | ORAL_TABLET | Freq: Four times a day (QID) | ORAL | Status: DC | PRN
  Administered 2013-08-29: 650 mg via ORAL
  Filled 2013-08-25: qty 2

## 2013-08-25 MED ORDER — SACCHAROMYCES BOULARDII 250 MG PO CAPS
250.0000 mg | ORAL_CAPSULE | Freq: Two times a day (BID) | ORAL | Status: DC
  Administered 2013-08-25 – 2013-08-31 (×12): 250 mg via ORAL
  Filled 2013-08-25 (×13): qty 1

## 2013-08-25 MED ORDER — MAGIC MOUTHWASH
15.0000 mL | Freq: Four times a day (QID) | ORAL | Status: DC | PRN
  Filled 2013-08-25: qty 15

## 2013-08-25 MED ORDER — LACTATED RINGERS IV BOLUS (SEPSIS): 1000.0000 mL | Freq: Once | INTRAVENOUS | Status: DC

## 2013-08-25 MED ORDER — MENTHOL 3 MG MT LOZG: 1.0000 | LOZENGE | OROMUCOSAL | Status: DC | PRN

## 2013-08-25 MED ORDER — METOPROLOL TARTRATE 1 MG/ML IV SOLN
5.0000 mg | Freq: Four times a day (QID) | INTRAVENOUS | Status: DC | PRN
  Filled 2013-08-25: qty 5

## 2013-08-25 MED ORDER — DIPHENHYDRAMINE HCL 50 MG/ML IJ SOLN: 12.5000 mg | Freq: Four times a day (QID) | INTRAMUSCULAR | Status: DC | PRN

## 2013-08-25 MED ORDER — SODIUM CHLORIDE 0.9 % IV BOLUS (SEPSIS)
1000.0000 mL | Freq: Once | INTRAVENOUS | Status: AC
  Administered 2013-08-25: 1000 mL via INTRAVENOUS

## 2013-08-25 MED ORDER — SODIUM CHLORIDE 0.9 % IV SOLN
8.0000 mg/h | INTRAVENOUS | Status: AC
  Administered 2013-08-25 – 2013-08-28 (×4): 8 mg/h via INTRAVENOUS
  Filled 2013-08-25 (×11): qty 80

## 2013-08-25 MED ORDER — ONDANSETRON HCL 4 MG/2ML IJ SOLN
4.0000 mg | Freq: Once | INTRAMUSCULAR | Status: AC
  Administered 2013-08-25: 4 mg via INTRAVENOUS
  Filled 2013-08-25: qty 2

## 2013-08-25 MED ORDER — ALUM & MAG HYDROXIDE-SIMETH 200-200-20 MG/5ML PO SUSP
30.0000 mL | Freq: Four times a day (QID) | ORAL | Status: DC | PRN
  Administered 2013-08-26: 30 mL via ORAL
  Filled 2013-08-25: qty 30

## 2013-08-25 MED ORDER — CALCIUM CARBONATE ANTACID 500 MG PO CHEW
1.0000 | CHEWABLE_TABLET | Freq: Every day | ORAL | Status: DC
  Administered 2013-08-25 – 2013-08-31 (×7): 200 mg via ORAL
  Filled 2013-08-25 (×7): qty 1

## 2013-08-25 NOTE — ED Notes (Signed)
Pt has catheter in place since surgery

## 2013-08-25 NOTE — Consult Note (Signed)
Referring Provider: Dr. Wilkie Aye Primary Care Physician:  Tally Due, MD Primary Gastroenterologist:  Gentry Fitz  Reason for Consultation:  Hematemesis  HPI: Nicholas Herring is a 80 y.o. male who is s/p bilateral inguinal hernia 2 days ago and presented with acute onset of black vomitus that his wife says occurred 12 times last night. Vomiting initially was "black flecks" and then it was black fluid. In the ER it has been dark bilious fluid. Denies any bright red blood in the vomitus and denies any melena or hematochezia. He has been having abdominal discomfort since surgery and poor appetite. Abdominal pain is periumbilical. No BMs yesterday.    Past Medical History  Diagnosis Date  . Incarcerated incisional hernia 2009  . Difficulty in urination   . Macular degeneration     Right eye  . Colon polyps   . Seborrheic keratoses, inflamed 2013  . Diverticulosis of sigmoid colon   . Osteoarthritis of both knees 08/17/2013  . Acute urinary retention 2009    Resolved after foley & flomax  . Enlarged prostate   . Sleep apnea     pt has CPAP machine but does not wear at bedtime    Past Surgical History  Procedure Laterality Date  . Cholecystectomy  1987  . Laparoscopic assisted ventral hernia repair  2009    Dr Ezzard Standing  . Stomach surgery  1990    reflux surgery?  . Inguinal hernia repair Right infant  . Cataract extraction Left   . Inguinal hernia repair Bilateral 08/23/2013    Procedure: LAPAROSCOPIC Bilateral INGUINAL HERNIA Repairs and TAPP block    ;  Surgeon: Ardeth Sportsman, MD;  Location: [facility] OR;  Service: General;  Laterality: Bilateral;  . Insertion of mesh Bilateral 08/23/2013    Procedure: INSERTION OF MESH ;  Surgeon: Ardeth Sportsman, MD;  Location: [facility] OR;  Service: General;  Laterality: Bilateral;  . Laparoscopic lysis of adhesions N/A 08/23/2013    Procedure: LAPAROSCOPIC LYSIS OF ADHESIONS;  Surgeon: Ardeth Sportsman, MD;  Location: [facility] OR;  Service: General;   Laterality: N/A;    Prior to Admission medications   Medication Sig Start Date End Date Taking? Authorizing Provider  calcium carbonate (TUMS EX) 750 MG chewable tablet Chew 1 tablet by mouth daily.   Yes Historical Provider, MD  diphenhydrAMINE (BENADRYL) 25 MG tablet Take 25 mg by mouth every 6 (six) hours as needed.   Yes Historical Provider, MD  naproxen sodium (ANAPROX) 220 MG tablet Take 220 mg by mouth 2 (two) times daily with a meal.   Yes Historical Provider, MD  oxyCODONE (OXY IR/ROXICODONE) 5 MG immediate release tablet Take 1-2 tablets (5-10 mg total) by mouth every 6 (six) hours as needed for moderate pain, severe pain or breakthrough pain. 08/23/13  Yes Ardeth Sportsman, MD  tamsulosin (FLOMAX) 0.4 MG CAPS capsule Take 1 capsule (0.4 mg total) by mouth daily. 08/17/13  Yes Ardeth Sportsman, MD    Scheduled Meds: Continuous Infusions: PRN Meds:.    Allergies as of 08/25/2013 - Review Complete 08/25/2013  Allergen Reaction Noted  . Morphine and related Nausea And Vomiting 02/01/2013    History reviewed. No pertinent family history.  History   Social History  . Marital Status: Married    Spouse Name: N/A    Number of Children: N/A  . Years of Education: N/A   Occupational History  . Retired   . Lawyer/Judge    Social History Main Topics  . Smoking  status: Former Smoker    Quit date: 08/12/1971  . Smokeless tobacco: Never Used  . Alcohol Use: 3.6 oz/week    6 Cans of beer per week     Comment: weekly  . Drug Use: No  . Sexual Activity: No   Other Topics Concern  . Not on file   Social History Narrative   Lives with his wife.  [employer] graduate, class of '52.    Review of Systems: All negative from a GI standpoint except as stated above in HPI.  Physical Exam: Vital signs: Filed Vitals:   08/25/13 1346  BP: 143/72  Pulse: 97  Temp: 98.8 F (37.1 C)  Resp: 17     General:   Elderly, frail, Alert, No acute distress HEENT: anicteric Lungs:   Clear throughout to auscultation.   No wheezes, crackles, or rhonchi. No acute distress. Heart:  Regular rate and rhythm; no murmurs, clicks, rubs,  or gallops. Abdomen: diffusely tender with guarding, lap port sites bandaged, soft, mild distention, hypoactive bowel sounds  Rectal:  Deferred Ext: no edema  GI:  Lab Results:  Recent Labs  08/25/13 1035  WBC 11.4*  HGB 12.7*  HCT 37.8*  PLT 250   BMET  Recent Labs  08/25/13 1035  NA 136*  K 4.1  CL 95*  CO2 26  GLUCOSE 174*  BUN 15  CREATININE 0.77  CALCIUM 9.3   LFT  Recent Labs  08/25/13 1035  PROT 7.1  ALBUMIN 3.4*  AST 22  ALT 12  ALKPHOS 139*  BILITOT 0.7   PT/INR No results found for this basename: LABPROT, INR,  in the last 72 hours   Studies/Results: Dg Abd Acute W/chest  08/25/2013   CLINICAL DATA:  Nausea and vomiting. Inguinal hernia repair 2 days ago.  EXAM: ACUTE ABDOMEN SERIES (ABDOMEN 2 VIEW & CHEST 1 VIEW)  COMPARISON:  Two-view chest 11/11/2007  FINDINGS: The heart size is normal. Lung volumes are low. The right hemidiaphragm is elevated.  Supine and upright views of the abdomen demonstrate dilated loops of small bowel measuring up to 5 cm. The decubitus images demonstrate multiple fluid levels. Gas and stool are present throughout the colon. Degenerative changes of the lumbar spine are evident with mild leftward curvature. A small amount free air is present, within normal limits following laparoscopic surgery.  IMPRESSION: 1. Multiple dilated loops of small bowel with fluid levels suggesting a diffuse ileus. 2. A small amount of free air is likely within normal limits following laparoscopic surgery 2 days ago. 3. Low lung volumes with elevation of the right hemidiaphragm. This is nonspecific, but may be related to the recent surgery as well.   Electronically Signed   By: Gennette Pachris  Mattern M.D.   On: 08/25/2013 11:29    Impression/Plan: 80 yo with diffuse ileus who has been having multiple episodes of  coffee ground emesis. Hemodynamically stable with Hgb 12.7. DDx erosive esophagitis vs. Peptic ulcer disease vs. Mallory-Weiss tear (less likely) vs. Mucosal bleeding. Protonix drip. EGD tomorrow. Keep NPO. Ileus management per surgery.    LOS: 0 days   Mohammedali Bedoy C.  08/25/2013, 2:25 PM

## 2013-08-25 NOTE — Telephone Encounter (Signed)
Patient's wife called to report that patient has been vomiting throughout the night dark fluid.  Wife states she spoke to the on-call MD last night and was told to bring him to the office today.  Explained that I don't see any notes from the on-call so I need to speak with Dr. Michaell CowingGross to find out what his suggestion would be.  Paged Dr. Michaell CowingGross at this time, who called right back for the OR. Dr. Michaell CowingGross stated that we needed to speak with on-call doctor but if patient is vomiting dark fluid and unable to keep anything down then patient needs to go to the ED.  I also spoke to Dr. Carolynne Edouardoth, on-call MD, who states that wife had told him they had an appt this morning in the office to have his foley removed and he stated that maybe someone could look at him while in the office.  Dr. Carolynne Edouardoth states he told patient and wife they needed to go to the ED however wife states their insurance would not cover an ambulance.  So in conclusion patient is continuing to vomit, no doctor is able to see him in office this morning, so they are being advised to go to the ED.  Spoke to wife at this time to advise her of this.  W[facility].  Wife just called back to ask if foley can go ahead and come out today.  I explained that Dr. Michaell CowingGross had wanted the foley to stay in 3 days so based on our order it would need to stay in until tomorrow.  Wife states understanding.

## 2013-08-25 NOTE — H&P (Signed)
Nicholas Herring  [birth year] [id number]  CARE TEAM:  PCP: Kennon Portela, MD  Outpatient Care Team: Patient Care Team: Orma Flaming, MD as PCP - General (Internal Medicine) Mayme Genta, MD as Consulting Physician (Gastroenterology) Almedia Balls, MD as Consulting Physician (Orthopedic Surgery)  Inpatient Treatment Team: Treatment Team: Attending Provider: Adin Hector, MD; Registered Nurse: Kenard Gower, RN; Consulting Physician: Lear Ng, MD; Consulting Physician: Adin Hector, MD; Technician: Delila Spence, NT  This patient is a 80 y.o.male who presents today for surgical evaluation at the request of wife / [facility] ED MD Dr Dina Rich.   Reason for evaluation: Constant vomiting  Pt is POD #2 Status post laparoscopic bilateral inguinal hernia repairs.  I had to go transabdominally given his prior ventral hernia repair with mesh.  History of urinary tension so therefore I left a Foley catheter in place to be removed on postop day 3.  Fairly the first night went well then he began to have nausea and vomiting.  Vomited all last night.  Cannot keep anything down.  His wife called concern.  We have recommended he go to the emergency room.  They do not want to do that.  We offered to have him seen in the office this morning.  I then called again concern.  We again stressed and going to the emergency room.  They have done that.  Dr. Festus Holts has evaluated the patient.  He is having coffee-ground emesis.  He does take Anaprox for years for arthritis.  He prefers that over Tylenol.  He has not had a bowel movement since surgery.  They have not really tried a laxative nor started a Fiber bowel regimen.  Since she has been in the ER, he received an enema without results.  He is feeling better after receiving some nausea medicines and IV fluids.  He wants the foley out.  Past Medical History  Diagnosis Date  . Incarcerated incisional hernia 2009  . Difficulty in urination   .  Macular degeneration     Right eye  . Colon polyps   . Seborrheic keratoses, inflamed 2013  . Diverticulosis of sigmoid colon   . Osteoarthritis of both knees 08/17/2013  . Acute urinary retention 2009    Resolved after foley & flomax  . Enlarged prostate   . Sleep apnea     pt has CPAP machine but does not wear at bedtime    Past Surgical History  Procedure Laterality Date  . Cholecystectomy  1987  . Laparoscopic assisted ventral hernia repair  2009    Dr Lucia Gaskins  . Stomach surgery  1990    reflux surgery?  . Inguinal hernia repair Right infant  . Cataract extraction Left   . Inguinal hernia repair Bilateral 08/23/2013    Procedure: LAPAROSCOPIC Bilateral INGUINAL HERNIA Repairs and TAPP block    ;  Surgeon: Adin Hector, MD;  Location: [place];  Service: General;  Laterality: Bilateral;  . Insertion of mesh Bilateral 08/23/2013    Procedure: INSERTION OF MESH ;  Surgeon: Adin Hector, MD;  Location: [place];  Service: General;  Laterality: Bilateral;  . Laparoscopic lysis of adhesions N/A 08/23/2013    Procedure: LAPAROSCOPIC LYSIS OF ADHESIONS;  Surgeon: Adin Hector, MD;  Location: [place];  Service: General;  Laterality: N/A;    History   Social History  . Marital Status: Married    Spouse Name: N/A    Number of Children: N/A  .  Years of Education: N/A   Occupational History  . Retired   . Lawyer/Judge    Social History Main Topics  . Smoking status: Former Smoker    Quit date: 08/12/1971  . Smokeless tobacco: Never Used  . Alcohol Use: 3.6 oz/week    6 Cans of beer per week     Comment: weekly  . Drug Use: No  . Sexual Activity: No   Other Topics Concern  . Not on file   Social History Narrative   Lives with his wife.  [employer], class of '[employer].    History reviewed. No pertinent family history.  Current Facility-Administered Medications  Medication Dose Route Frequency Provider Last Rate Last Dose  . acetaminophen (TYLENOL) suppository  650 mg  650 mg Rectal Q6H PRN Adin Hector, MD      . acetaminophen (TYLENOL) tablet 325-650 mg  325-650 mg Oral Q6H PRN Adin Hector, MD      . alum & mag hydroxide-simeth (MAALOX/MYLANTA) 200-200-20 MG/5ML suspension 30 mL  30 mL Oral Q6H PRN Adin Hector, MD      . calcium carbonate (TUMS EX) chewable tablet 750 mg  1 tablet Oral Daily Adin Hector, MD      . dextrose 5 % and 0.45 % NaCl with KCl 40 mEq/L infusion   Intravenous Continuous Adin Hector, MD      . diphenhydrAMINE (BENADRYL) injection 12.5-25 mg  12.5-25 mg Intravenous Q6H PRN Adin Hector, MD       Or  . diphenhydrAMINE (BENADRYL) 12.5 MG/5ML elixir 12.5-25 mg  12.5-25 mg Oral Q6H PRN Adin Hector, MD      . diphenhydrAMINE (BENADRYL) tablet 25 mg  25 mg Oral Q6H PRN Adin Hector, MD      . HYDROmorphone (DILAUDID) injection 0.5-2 mg  0.5-2 mg Intravenous Q2H PRN Adin Hector, MD      . lactated ringers bolus 1,000 mL  1,000 mL Intravenous Once Adin Hector, MD      . lactated ringers bolus 1,000 mL  1,000 mL Intravenous Q8H PRN Adin Hector, MD      . lip balm (CARMEX) ointment 1 application  1 application Topical BID Adin Hector, MD      . magic mouthwash  15 mL Oral QID PRN Adin Hector, MD      . menthol-cetylpyridinium (CEPACOL) lozenge 3 mg  1 lozenge Oral PRN Adin Hector, MD      . metoprolol (LOPRESSOR) injection 5 mg  5 mg Intravenous Q6H PRN Adin Hector, MD      . ondansetron [facility]) injection 4 mg  4 mg Intravenous Q6H PRN Adin Hector, MD      . pantoprazole (PROTONIX) 80 mg in sodium chloride 0.9 % 250 mL infusion  8 mg/hr Intravenous Continuous Lear Ng, MD 25 mL/hr at 08/25/13 1629 8 mg/hr at 08/25/13 1629  . [START ON 08/29/2013] pantoprazole (PROTONIX) injection 40 mg  40 mg Intravenous Q12H Lear Ng, MD      . phenol (CHLORASEPTIC) mouth spray 2 spray  2 spray Mouth/Throat PRN Adin Hector, MD      . saccharomyces boulardii (FLORASTOR)  capsule 250 mg  250 mg Oral BID Adin Hector, MD      . tamsulosin (FLOMAX) capsule 0.4 mg  0.4 mg Oral Daily Adin Hector, MD      . traMADol Veatrice Bourbon) tablet 50-100 mg  50-100 mg  Oral Q6H PRN Adin Hector, MD       Current Outpatient Prescriptions  Medication Sig Dispense Refill  . calcium carbonate (TUMS EX) 750 MG chewable tablet Chew 1 tablet by mouth daily.      . diphenhydrAMINE (BENADRYL) 25 MG tablet Take 25 mg by mouth every 6 (six) hours as needed.      Marland Kitchen oxyCODONE (OXY IR/ROXICODONE) 5 MG immediate release tablet Take 1-2 tablets (5-10 mg total) by mouth every 6 (six) hours as needed for moderate pain, severe pain or breakthrough pain.  40 tablet  0  . tamsulosin (FLOMAX) 0.4 MG CAPS capsule Take 1 capsule (0.4 mg total) by mouth daily.  30 capsule  1     Allergies  Allergen Reactions  . Morphine And Related Nausea And Vomiting    ROS: Constitutional:  No fevers, chills, sweats.  Weight stable Eyes:  No vision changes, No discharge HENT:  No sore throats, nasal drainage Lymph: No neck swelling, No bruising easily Pulmonary:  No cough, productive sputum CV: No orthopnea, PND  . GI: No personal nor family history of GI/colon cancer, inflammatory bowel disease, irritable bowel syndrome, allergy such as Celiac Sprue, dietary/dairy problems, colitis, ulcers nor gastritis.  No recent sick contacts/gastroenteritis.  No travel outside the country.  No changes in diet. Renal: No UTIs, No hematuria Genital:  No drainage, bleeding, masses Musculoskeletal: No severe joint pain.  Good ROM major joints Skin:  No sores or lesions.  No rashes Heme/Lymph:  No easy bleeding.  No swollen lymph nodes Neuro: No focal weakness/numbness.  No seizures Psych: No suicidal ideation.  No hallucinations  BP 143/72  Pulse 97  Temp(Src) 98.8 F (37.1 C) (Oral)  Resp 17  SpO2 94%  Physical Exam: General: Pt awake/alert/oriented x4 in no major acute distress.  Talkative/Inquisitive Eyes:  PERRL, normal EOM. Sclera nonicteric Neuro: CN II-XII intact w/o focal sensory/motor deficits. Lymph: No head/neck/groin lymphadenopathy Psych:  No delerium/psychosis/paranoia HENT: Normocephalic, Mucus membranes moist.  No thrush Neck: Supple, No tracheal deviation Chest: No pain.  Good respiratory excursion. CV:  Pulses intact.  Regular rhythm Abdomen: Soft, Nondistended.  Nontender.  No incarcerated hernias.  Lap incisions well healed.  No peritonitis GU: NEMG.  Foley in place Ext:  SCDs BLE.  No significant edema.  No cyanosis Skin: No petechiae / purpurea.  No major sores Musculoskeletal: No severe joint pain.  Good ROM major joints   Results:   Labs: Results for orders placed during the hospital encounter of 08/25/13 (from the past 48 hour(s))  CBC WITH DIFFERENTIAL     Status: Abnormal   Collection Time    08/25/13 10:35 AM      Result Value Range   WBC 11.4 (*) 4.0 - 10.5 K/uL   RBC 4.29  4.22 - 5.81 MIL/uL   Hemoglobin 12.7 (*) 13.0 - 17.0 g/dL   HCT 37.8 (*) 39.0 - 52.0 %   MCV 88.1  78.0 - 100.0 fL   MCH 29.6  26.0 - 34.0 pg   MCHC 33.6  30.0 - 36.0 g/dL   RDW 14.7  11.5 - 15.5 %   Platelets 250  150 - 400 K/uL   Neutrophils Relative % 84 (*) 43 - 77 %   Neutro Abs 9.6 (*) 1.7 - 7.7 K/uL   Lymphocytes Relative 8 (*) 12 - 46 %   Lymphs Abs 0.9  0.7 - 4.0 K/uL   Monocytes Relative 8  3 - 12 %   Monocytes  Absolute 0.9  0.1 - 1.0 K/uL   Eosinophils Relative 0  0 - 5 %   Eosinophils Absolute 0.0  0.0 - 0.7 K/uL   Basophils Relative 0  0 - 1 %   Basophils Absolute 0.0  0.0 - 0.1 K/uL  COMPREHENSIVE METABOLIC PANEL     Status: Abnormal   Collection Time    08/25/13 10:35 AM      Result Value Range   Sodium 136 (*) 137 - 147 mEq/L   Potassium 4.1  3.7 - 5.3 mEq/L   Chloride 95 (*) 96 - 112 mEq/L   CO2 26  19 - 32 mEq/L   Glucose, Bld 174 (*) 70 - 99 mg/dL   BUN 15  6 - 23 mg/dL   Creatinine, Ser 0.77  0.50 - 1.35 mg/dL   Calcium 9.3  8.4 - 10.5 mg/dL   Total  Protein 7.1  6.0 - 8.3 g/dL   Albumin 3.4 (*) 3.5 - 5.2 g/dL   AST 22  0 - 37 U/L   ALT 12  0 - 53 U/L   Alkaline Phosphatase 139 (*) 39 - 117 U/L   Total Bilirubin 0.7  0.3 - 1.2 mg/dL   GFR calc non Af Amer 81 (*) >90 mL/min   GFR calc Af Amer >90  >90 mL/min   Comment: (NOTE)     The eGFR has been calculated using the CKD EPI equation.     This calculation has not been validated in all clinical situations.     eGFR's persistently <90 mL/min signify possible Chronic Kidney     Disease.  LIPASE, BLOOD     Status: Abnormal   Collection Time    08/25/13 10:35 AM      Result Value Range   Lipase 10 (*) 11 - 59 U/L  OCCULT BLOOD GASTRIC / DUODENUM (SPECIMEN CUP)     Status: Abnormal   Collection Time    08/25/13 10:38 AM      Result Value Range   pH, Gastric 4     Occult Blood, Gastric POSITIVE (*) NEGATIVE  CG4 I-STAT (LACTIC ACID)     Status: None   Collection Time    08/25/13 10:47 AM      Result Value Range   Lactic Acid, Venous 1.50  0.5 - 2.2 mmol/L  URINALYSIS, ROUTINE W REFLEX MICROSCOPIC     Status: Abnormal   Collection Time    08/25/13 12:28 PM      Result Value Range   Color, Urine AMBER (*) YELLOW   Comment: BIOCHEMICALS MAY BE AFFECTED BY COLOR   APPearance CLOUDY (*) CLEAR   Specific Gravity, Urine 1.025  1.005 - 1.030   pH 6.0  5.0 - 8.0   Glucose, UA NEGATIVE  NEGATIVE mg/dL   Hgb urine dipstick LARGE (*) NEGATIVE   Bilirubin Urine MODERATE (*) NEGATIVE   Ketones, ur >80 (*) NEGATIVE mg/dL   Protein, ur 30 (*) NEGATIVE mg/dL   Urobilinogen, UA 1.0  0.0 - 1.0 mg/dL   Nitrite NEGATIVE  NEGATIVE   Leukocytes, UA MODERATE (*) NEGATIVE  URINE MICROSCOPIC-ADD ON     Status: Abnormal   Collection Time    08/25/13 12:28 PM      Result Value Range   WBC, UA 7-10  <3 WBC/hpf   RBC / HPF 11-20  <3 RBC/hpf   Bacteria, UA FEW (*) RARE   Casts HYALINE CASTS (*) NEGATIVE   Urine-Other MUCOUS PRESENT  Imaging / Studies: Dg Abd Acute W/chest  08/25/2013    CLINICAL DATA:  Nausea and vomiting. Inguinal hernia repair 2 days ago.  EXAM: ACUTE ABDOMEN SERIES (ABDOMEN 2 VIEW & CHEST 1 VIEW)  COMPARISON:  Two-view chest 11/11/2007  FINDINGS: The heart size is normal. Lung volumes are low. The right hemidiaphragm is elevated.  Supine and upright views of the abdomen demonstrate dilated loops of small bowel measuring up to 5 cm. The decubitus images demonstrate multiple fluid levels. Gas and stool are present throughout the colon. Degenerative changes of the lumbar spine are evident with mild leftward curvature. A small amount free air is present, within normal limits following laparoscopic surgery.  IMPRESSION: 1. Multiple dilated loops of small bowel with fluid levels suggesting a diffuse ileus. 2. A small amount of free air is likely within normal limits following laparoscopic surgery 2 days ago. 3. Low lung volumes with elevation of the right hemidiaphragm. This is nonspecific, but may be related to the recent surgery as well.   Electronically Signed   By: Lawrence Santiago M.D.   On: 08/25/2013 11:29    Medications / Allergies: per chart  Antibiotics: Anti-infectives   None      Assessment  Nicholas Herring  80 y.o. male     Procedure(s): ESOPHAGOGASTRODUODENOSCOPY (EGD)  Problem List:  Principal Problem:   Hematemesis Active Problems:   Difficulty in urination   Recurrent bilateral inguinal hernia (BIH) s/p lap repair w mesh 08/23/2013   Hematemesis with probable ileus  POD#2 s/p lap BIH repairs w/o evidence of abd catastrophe  Plan:  -admit -IVF -stop NSAIDs (?taking naproxen at home) -Agree w GI rec for PPI & EGD -try enema again to help w BM Keep foley until POD#3 to minimize recurrent urinary retention (d/w Urology) -VTE prophylaxis- SCDs, etc -mobilize as tolerated to help recovery    Adin Hector, M.D., F.A.C.S. Gastrointestinal and Minimally Invasive Surgery [facility] [address], [place], [place] [address] [phone] Main / Paging   08/25/2013  Note: This dictation was prepared with Dragon/digital dictation along with [facility] technology. Any transcriptional errors that result from this process are unintentional.

## 2013-08-25 NOTE — ED Notes (Signed)
Pt states vomited 8 times last night; vomited black emesis all night; pt c/o chest pain states since Tuesday; states had bilateral inguinal hernia repair on Tuesday

## 2013-08-25 NOTE — ED Provider Notes (Signed)
CSN: [id number]     Arrival date & time 08/25/13  [id number] History   First MD Initiated Contact with Patient 08/25/13 1010     Chief Complaint  Patient presents with  . Emesis   (Consider location/radiation/quality/duration/timing/severity/associated sxs/prior Treatment) HPI  This is an 80 year old now with history of recent hernia repair and urinary retention currently with a Foley in place who presents with abdominal pain and emesis. Patient reports multiple episodes of dark emesis last night. He reports vomiting multiple times every 45 minutes or so. He reports very umbilical abdominal pain that is nonradiating. Currently his pain is 5/10. Patient had bilateral inguinal hernia repair on Tuesday and was discharged home with a Foley for urinary retention. He reports that he has not had any bowel movements yet. He denies any fevers.  Past Medical History  Diagnosis Date  . Incarcerated incisional hernia 2009  . Difficulty in urination   . Macular degeneration     Right eye  . Colon polyps   . Seborrheic keratoses, inflamed 2013  . Diverticulosis of sigmoid colon   . Osteoarthritis of both knees 08/17/2013  . Acute urinary retention 2009    Resolved after foley & flomax  . Enlarged prostate   . Sleep apnea     pt has CPAP machine but does not wear at bedtime   Past Surgical History  Procedure Laterality Date  . Cholecystectomy  1987  . Laparoscopic assisted ventral hernia repair  2009    Dr Ezzard StandingNewman  . Stomach surgery  1990    reflux surgery?  . Inguinal hernia repair Right infant  . Cataract extraction Left   . Inguinal hernia repair Bilateral 08/23/2013    Procedure: LAPAROSCOPIC Bilateral INGUINAL HERNIA Repairs and TAPP block    ;  Surgeon: Ardeth SportsmanSteven C. Gross, MD;  Location: [facility] OR;  Service: General;  Laterality: Bilateral;  . Insertion of mesh Bilateral 08/23/2013    Procedure: INSERTION OF MESH ;  Surgeon: Ardeth SportsmanSteven C. Gross, MD;  Location: [facility] OR;  Service: General;  Laterality:  Bilateral;  . Laparoscopic lysis of adhesions N/A 08/23/2013    Procedure: LAPAROSCOPIC LYSIS OF ADHESIONS;  Surgeon: Ardeth SportsmanSteven C. Gross, MD;  Location: [facility] OR;  Service: General;  Laterality: N/A;   History reviewed. No pertinent family history. History  Substance Use Topics  . Smoking status: Former Smoker    Quit date: 08/12/1971  . Smokeless tobacco: Never Used  . Alcohol Use: 3.6 oz/week    6 Cans of beer per week     Comment: weekly    Review of Systems  Constitutional: Negative.  Negative for fever.  Respiratory: Negative.  Negative for chest tightness and shortness of breath.   Cardiovascular: Negative.  Negative for chest pain.  Gastrointestinal: Positive for nausea, vomiting, abdominal pain and constipation. Negative for diarrhea.  Genitourinary: Negative.  Negative for dysuria.  Musculoskeletal: Negative for back pain.  Neurological: Negative for headaches.  All other systems reviewed and are negative.    Allergies  Morphine and related  Home Medications   Current Outpatient Rx  Name  Route  Sig  Dispense  Refill  . calcium carbonate (TUMS EX) 750 MG chewable tablet   Oral   Chew 1 tablet by mouth daily.         . diphenhydrAMINE (BENADRYL) 25 MG tablet   Oral   Take 25 mg by mouth every 6 (six) hours as needed.         . naproxen sodium (ANAPROX) 220 MG  tablet   Oral   Take 220 mg by mouth 2 (two) times daily with a meal.         . oxyCODONE (OXY IR/ROXICODONE) 5 MG immediate release tablet   Oral   Take 1-2 tablets (5-10 mg total) by mouth every 6 (six) hours as needed for moderate pain, severe pain or breakthrough pain.   40 tablet   0   . tamsulosin (FLOMAX) 0.4 MG CAPS capsule   Oral   Take 1 capsule (0.4 mg total) by mouth daily.   30 capsule   1    BP 143/72  Pulse 97  Temp(Src) 98.8 F (37.1 C) (Oral)  Resp 17  SpO2 94% Physical Exam  Nursing note and vitals reviewed. Constitutional: He is oriented to person, place, and time. No  distress.  elderly  HENT:  Head: Normocephalic and atraumatic.  MM dry  Eyes: Pupils are equal, round, and reactive to light.  Neck: Neck supple.  Cardiovascular: Normal rate, regular rhythm and normal heart sounds.   No murmur heard. Pulmonary/Chest: Effort normal and breath sounds normal. No respiratory distress. He has no wheezes.  Abdominal: Soft. Bowel sounds are normal. There is no tenderness. There is no rebound and no guarding.  Multiple laparoscopic abdominal incisions, clean dry and intact, no tenderness noted, abdomen soft  Musculoskeletal: He exhibits no edema.  Lymphadenopathy:    He has no cervical adenopathy.  Neurological: He is alert and oriented to person, place, and time.  Skin: Skin is warm and dry.  Psychiatric: He has a normal mood and affect.    ED Course  Procedures (including critical care time) Labs Review Labs Reviewed  OCCULT BLOOD GASTRIC / DUODENUM (SPECIMEN CUP) - Abnormal; Notable for the following:    Occult Blood, Gastric POSITIVE (*)    All other components within normal limits  CBC WITH DIFFERENTIAL - Abnormal; Notable for the following:    WBC 11.4 (*)    Hemoglobin 12.7 (*)    HCT 37.8 (*)    Neutrophils Relative % 84 (*)    Neutro Abs 9.6 (*)    Lymphocytes Relative 8 (*)    All other components within normal limits  COMPREHENSIVE METABOLIC PANEL - Abnormal; Notable for the following:    Sodium 136 (*)    Chloride 95 (*)    Glucose, Bld 174 (*)    Albumin 3.4 (*)    Alkaline Phosphatase 139 (*)    GFR calc non Af Amer 81 (*)    All other components within normal limits  URINALYSIS, ROUTINE W REFLEX MICROSCOPIC - Abnormal; Notable for the following:    Color, Urine AMBER (*)    APPearance CLOUDY (*)    Hgb urine dipstick LARGE (*)    Bilirubin Urine MODERATE (*)    Ketones, ur >80 (*)    Protein, ur 30 (*)    Leukocytes, UA MODERATE (*)    All other components within normal limits  LIPASE, BLOOD - Abnormal; Notable for the  following:    Lipase 10 (*)    All other components within normal limits  URINE MICROSCOPIC-ADD ON - Abnormal; Notable for the following:    Bacteria, UA FEW (*)    Casts HYALINE CASTS (*)    All other components within normal limits  URINE CULTURE  CG4 I-STAT (LACTIC ACID)   Imaging Review Dg Abd Acute W/chest  08/25/2013   CLINICAL DATA:  Nausea and vomiting. Inguinal hernia repair 2 days ago.  EXAM: ACUTE  ABDOMEN SERIES (ABDOMEN 2 VIEW & CHEST 1 VIEW)  COMPARISON:  Two-view chest 11/11/2007  FINDINGS: The heart size is normal. Lung volumes are low. The right hemidiaphragm is elevated.  Supine and upright views of the abdomen demonstrate dilated loops of small bowel measuring up to 5 cm. The decubitus images demonstrate multiple fluid levels. Gas and stool are present throughout the colon. Degenerative changes of the lumbar spine are evident with mild leftward curvature. A small amount free air is present, within normal limits following laparoscopic surgery.  IMPRESSION: 1. Multiple dilated loops of small bowel with fluid levels suggesting a diffuse ileus. 2. A small amount of free air is likely within normal limits following laparoscopic surgery 2 days ago. 3. Low lung volumes with elevation of the right hemidiaphragm. This is nonspecific, but may be related to the recent surgery as well.   Electronically Signed   By: Gennette Pac M.D.   On: 08/25/2013 11:29    EKG Interpretation    Date/Time:  Thursday August 25 2013 10:22:35 EST Ventricular Rate:  97 PR Interval:  164 QRS Duration: 107 QT Interval:  361 QTC Calculation: 459 R Axis:   42 Text Interpretation:  Sinus rhythm No significant change was found Confirmed by HORTON  MD, COURTNEY ([id number]) on 08/25/2013 10:26:25 AM            MDM   1. Ileus   2. Coffee ground emesis     Patient presents with vomiting and coffee-ground emesis following surgery 2 days ago. He is nontoxic-appearing on exam but is actively vomiting. He  has not had a bowel movement since surgery. Basic labwork was obtained. Lactate is normal. Acute abdominal series concerning for postop ileus.  Gastric occult is positive.  Enema ordered per Dr. gross. Patient will be admitted by Dr. gross. Have called GI also to evaluate patient for upper abdominal bleed. Patient was given IV protonix.    Shon Baton, MD 08/25/13 [phone]

## 2013-08-26 ENCOUNTER — Encounter (HOSPITAL_COMMUNITY)
Admission: EM | Disposition: A | Discharge status: Home / Self Care | Payer: Self-pay | Source: Home / Self Care | Attending: Surgery

## 2013-08-26 ENCOUNTER — Encounter (INDEPENDENT_AMBULATORY_CARE_PROVIDER_SITE_OTHER): Payer: Medicare Other

## 2013-08-26 ENCOUNTER — Encounter (HOSPITAL_COMMUNITY): Payer: Self-pay

## 2013-08-26 DIAGNOSIS — K921 Melena: Secondary | ICD-10-CM | POA: Diagnosis present

## 2013-08-26 HISTORY — PX: ESOPHAGOGASTRODUODENOSCOPY: SHX5428

## 2013-08-26 LAB — URINE CULTURE
COLONY COUNT: NO GROWTH
CULTURE: NO GROWTH

## 2013-08-26 LAB — BASIC METABOLIC PANEL
BUN: 13 mg/dL (ref 6–23)
CO2: 27 meq/L (ref 19–32)
CREATININE: 0.69 mg/dL (ref 0.50–1.35)
Calcium: 8.6 mg/dL (ref 8.4–10.5)
Chloride: 104 mEq/L (ref 96–112)
GFR calc Af Amer: >90 mL/min (ref >90)
GFR calc non Af Amer: 85 mL/min — ABNORMAL LOW (ref >90)
Glucose, Bld: 148 mg/dL — ABNORMAL HIGH (ref 70–99)
POTASSIUM: 3.7 meq/L (ref 3.7–5.3)
Sodium: 144 mEq/L (ref 137–147)

## 2013-08-26 LAB — CBC
HEMATOCRIT: 35.9 % — AB (ref 39.0–52.0)
Hemoglobin: 11.4 g/dL — ABNORMAL LOW (ref 13.0–17.0)
MCH: 28.5 pg (ref 26.0–34.0)
MCHC: 31.8 g/dL (ref 30.0–36.0)
MCV: 89.8 fL (ref 78.0–100.0)
Platelets: 228 10*3/uL (ref 150–400)
RBC: 4 MIL/uL — ABNORMAL LOW (ref 4.22–5.81)
RDW: 14.9 % (ref 11.5–15.5)
WBC: 9 10*3/uL (ref 4.0–10.5)

## 2013-08-26 SURGERY — EGD (ESOPHAGOGASTRODUODENOSCOPY)
Anesthesia: Moderate Sedation

## 2013-08-26 MED ORDER — FENTANYL CITRATE 0.05 MG/ML IJ SOLN
INTRAMUSCULAR | Status: AC
  Filled 2013-08-26: qty 2

## 2013-08-26 MED ORDER — MIDAZOLAM HCL 10 MG/2ML IJ SOLN
INTRAMUSCULAR | Status: DC | PRN
  Administered 2013-08-26 (×2): 1 mg via INTRAVENOUS

## 2013-08-26 MED ORDER — FENTANYL CITRATE 0.05 MG/ML IJ SOLN
INTRAMUSCULAR | Status: DC | PRN
  Administered 2013-08-26 (×2): 12.5 ug via INTRAVENOUS

## 2013-08-26 MED ORDER — ONDANSETRON HCL 4 MG/2ML IJ SOLN
INTRAMUSCULAR | Status: AC
  Filled 2013-08-26: qty 2

## 2013-08-26 MED ORDER — MIDAZOLAM HCL 10 MG/2ML IJ SOLN
INTRAMUSCULAR | Status: AC
  Filled 2013-08-26: qty 2

## 2013-08-26 MED ORDER — DIPHENHYDRAMINE HCL 50 MG/ML IJ SOLN
INTRAMUSCULAR | Status: AC
  Filled 2013-08-26: qty 1

## 2013-08-26 MED ORDER — SODIUM CHLORIDE 0.9 % IV SOLN
INTRAVENOUS | Status: DC
  Administered 2013-08-26: 500 mL via INTRAVENOUS

## 2013-08-26 MED ORDER — BUTAMBEN-TETRACAINE-BENZOCAINE 2-2-14 % EX AERO
INHALATION_SPRAY | CUTANEOUS | Status: DC | PRN
  Administered 2013-08-26: 1 via TOPICAL

## 2013-08-26 NOTE — Op Note (Signed)
[facility] [address] [place], [id number]   ENDOSCOPY PROCEDURE REPORT  PATIENT: Nicholas Herring, Nicholas B.  MR#: [id number] BIRTHDATE: [birth year] , 86  yrs. old GENDER: Male  ENDOSCOPIST: Charlott RakesVincent Rexanne Inocencio, MD REFERRED XB:JYNWGNBY:Steven Gross, M.D.  PROCEDURE DATE:  08/26/2013 PROCEDURE:   EGD w/ biopsy ASA CLASS:   Class III INDICATIONS:Melena. MEDICATIONS: Fentanyl 25 mcg IV, Versed 2 mg IV, and Cetacaine spray x 1  TOPICAL ANESTHETIC:  DESCRIPTION OF PROCEDURE:   After the risks benefits and alternatives of the procedure were thoroughly explained, informed consent was obtained.  The Pentax Gastroscope [id number]  endoscope was introduced through the mouth and advanced to the second portion of the duodenum , limited by Without limitations.   The instrument was slowly withdrawn as the mucosa was fully examined.     FINDINGS: The endoscope was inserted into the oropharynx and esophagus was intubated.  In the mid and distal esophagus the mucosa was noted to be ulcerated with erythema with minimal bleeding and nodular folds distal to the ulcerated mucosa near the GEJ. The gastroesophageal junction was noted to be 36 cm from the incisors. Bile was noted pooling in the esophagus. Endoscope was advanced into the stomach where a large amount of dark green bile was noted in the dependent portion of the stomach.  This bile was suctioned and minimal erythema was seen in the distal stomach but otherwise no mucosal abnormality was seen in the stomach. The endoscope was advanced to the duodenal bulb and second portion of duodenum which were unremarkable and a moderate amount of bile was seen in the examined duodenum.  The endoscope was withdrawn back into the stomach and retroflexion revealed a normal proximal stomach. The endoscope was withdrawn back into the esophagus and a brushing was obtained for cytology, which caused a small amount of bleeding due to the friable ulcerated  mucosa. A biopsy was taken of the nodular mucosa in the distal esophagus for histology.  COMPLICATIONS: None  ENDOSCOPIC IMPRESSION:     1. Erosive esophagitis and esophageal ulcer - minimal bleeding noted on insertion but mucosa was very friable - s/p biopsy and brushing 2. Moderate amount of dark green bile noted in stomach and examined duodenum concerning for a small bowel obstruction   RECOMMENDATIONS: NPO; F/U on path; Continue Protonix drip; Avoid NG tube due to very friable esophagus unless vomiting recurs and persists   REPEAT EXAM: N/A  _______________________________ Charlott RakesVincent Carlis Burnsworth, MD eSigned:  Charlott RakesVincent Esa Raden, MD 08/26/2013 10:23 AM    FA:OZHYQMCC:Steven Gross, MD  PATIENT NAME:  Nicholas Herring, Nicholas B. MR#: [id number]

## 2013-08-26 NOTE — Progress Notes (Signed)
Nicholas Herring [id number] 05/22/1927  CARE TEAM:  PCP: Kennon Portela, MD  Outpatient Care Team: Patient Care Team: Orma Flaming, MD as PCP - General (Internal Medicine) Mayme Genta, MD as Consulting Physician (Gastroenterology) Almedia Balls, MD as Consulting Physician (Orthopedic Surgery)  Inpatient Treatment Team: Treatment Team: Attending Provider: Adin Hector, MD; Consulting Physician: Lear Ng, MD; Consulting Physician: Adin Hector, MD; Technician: Delila Spence, NT; Registered Nurse: Emeterio Reeve, RN   Subjective:  The patient feeling much better.  Had a bowel movement with flatus.  Had one episode of mild retching last night but much improved.  Hungry.  Wants Foley out.  Wanting to get up and walk more to  Objective:  Vital signs:  Filed Vitals:   08/25/13 1840 08/25/13 2128 08/26/13 0144 08/26/13 0538  BP: 138/63 154/77 153/84 158/80  Pulse: 88 95 96 83  Temp: 98.4 F (36.9 C) 98.6 F (37 C) 98.3 F (36.8 C) 98.1 F (36.7 C)  TempSrc: Oral Oral Oral Oral  Resp: $Remo'18 18 18 16  'oczpZ$ SpO2: 95% 94% 94% 97%    Last BM Date: 08/26/13  Intake/Output   Yesterday:  01/15 0701 - 01/16 0700 In: 906.7 [I.V.:906.7] Out: 250 [Urine:250] This shift:     Bowel function:  Flatus: y  BM: y  Drain: foley urine clear yellow  Physical Exam:  General: Pt awake/alert/oriented x4 in no acute distress Eyes: PERRL, normal EOM.  Sclera clear.  No icterus Neuro: CN II-XII intact w/o focal sensory/motor deficits. Lymph: No head/neck/groin lymphadenopathy Psych:  No delerium/psychosis/paranoia.  Tends to interrupt to ask questions. HENT: Normocephalic, Mucus membranes moist.  No thrush Neck: Supple, No tracheal deviation Chest: No chest wall pain w good excursion CV:  Pulses intact.  Regular rhythm MS: Normal AROM mjr joints.  No obvious deformity Abdomen: Soft.  Nondistended.  Nontender at incisions.  No evidence of peritonitis.   No incarcerated hernias. Ext:  SCDs BLE.  No mjr edema.  No cyanosis Skin: No petechiae / purpura   Problem List:   Principal Problem:   Hematemesis Active Problems:   Difficulty in urination   Recurrent bilateral inguinal hernia (BIH) s/p lap repair w mesh 08/23/2013   Ileus, postoperative   Assessment  Nicholas Herring  80 y.o. male     Procedure(s): ESOPHAGOGASTRODUODENOSCOPY (EGD)  Improved  Plan:  Foley out.  I&O when necessary.  If need recurrent Foley, urology consult.  Upper endoscopy this morning to rule out gastritis/ulcer.  Highly suspicious.  Continue continuous IV Protonix.  Stop using any nonsteroidals.  Tylenol only for pain.  Bowel regimen to avoid further episodes of constipation/ileus.  -VTE prophylaxis- SCDs, etc  -mobilize as tolerated to help recovery  Adin Hector, M.D., F.A.C.S. Gastrointestinal and Minimally Invasive Surgery [facility] [address] [place], [place] [address] [phone] Main / Paging   08/26/2013   Results:   Labs: Results for orders placed during the hospital encounter of 08/25/13 (from the past 48 hour(s))  CBC WITH DIFFERENTIAL     Status: Abnormal   Collection Time    08/25/13 10:35 AM      Result Value Range   WBC 11.4 (*) 4.0 - 10.5 K/uL   RBC 4.29  4.22 - 5.81 MIL/uL   Hemoglobin 12.7 (*) 13.0 - 17.0 g/dL   HCT 37.8 (*) 39.0 - 52.0 %   MCV 88.1  78.0 - 100.0 fL   MCH 29.6  26.0 - 34.0 pg   MCHC 33.6  30.0 - 36.0 g/dL   RDW 14.7  11.5 - 15.5 %   Platelets 250  150 - 400 K/uL   Neutrophils Relative % 84 (*) 43 - 77 %   Neutro Abs 9.6 (*) 1.7 - 7.7 K/uL   Lymphocytes Relative 8 (*) 12 - 46 %   Lymphs Abs 0.9  0.7 - 4.0 K/uL   Monocytes Relative 8  3 - 12 %   Monocytes Absolute 0.9  0.1 - 1.0 K/uL   Eosinophils Relative 0  0 - 5 %   Eosinophils Absolute 0.0  0.0 - 0.7 K/uL   Basophils Relative 0  0 - 1 %   Basophils Absolute 0.0  0.0 - 0.1 K/uL  COMPREHENSIVE  METABOLIC PANEL     Status: Abnormal   Collection Time    08/25/13 10:35 AM      Result Value Range   Sodium 136 (*) 137 - 147 mEq/L   Potassium 4.1  3.7 - 5.3 mEq/L   Chloride 95 (*) 96 - 112 mEq/L   CO2 26  19 - 32 mEq/L   Glucose, Bld 174 (*) 70 - 99 mg/dL   BUN 15  6 - 23 mg/dL   Creatinine, Ser 0.77  0.50 - 1.35 mg/dL   Calcium 9.3  8.4 - 10.5 mg/dL   Total Protein 7.1  6.0 - 8.3 g/dL   Albumin 3.4 (*) 3.5 - 5.2 g/dL   AST 22  0 - 37 U/L   ALT 12  0 - 53 U/L   Alkaline Phosphatase 139 (*) 39 - 117 U/L   Total Bilirubin 0.7  0.3 - 1.2 mg/dL   GFR calc non Af Amer 81 (*) >90 mL/min   GFR calc Af Amer >90  >90 mL/min   Comment: (NOTE)     The eGFR has been calculated using the CKD EPI equation.     This calculation has not been validated in all clinical situations.     eGFR's persistently <90 mL/min signify possible Chronic Kidney     Disease.  LIPASE, BLOOD     Status: Abnormal   Collection Time    08/25/13 10:35 AM      Result Value Range   Lipase 10 (*) 11 - 59 U/L  OCCULT BLOOD GASTRIC / DUODENUM (SPECIMEN CUP)     Status: Abnormal   Collection Time    08/25/13 10:38 AM      Result Value Range   pH, Gastric 4     Occult Blood, Gastric POSITIVE (*) NEGATIVE  CG4 I-STAT (LACTIC ACID)     Status: None   Collection Time    08/25/13 10:47 AM      Result Value Range   Lactic Acid, Venous 1.50  0.5 - 2.2 mmol/L  URINALYSIS, ROUTINE W REFLEX MICROSCOPIC     Status: Abnormal   Collection Time    08/25/13 12:28 PM      Result Value Range   Color, Urine AMBER (*) YELLOW   Comment: BIOCHEMICALS MAY BE AFFECTED BY COLOR   APPearance CLOUDY (*) CLEAR   Specific Gravity, Urine 1.025  1.005 - 1.030   pH 6.0  5.0 - 8.0   Glucose, UA NEGATIVE  NEGATIVE mg/dL   Hgb urine dipstick LARGE (*) NEGATIVE   Bilirubin Urine MODERATE (*) NEGATIVE   Ketones, ur >80 (*) NEGATIVE mg/dL   Protein, ur 30 (*) NEGATIVE mg/dL   Urobilinogen, UA 1.0  0.0 - 1.0 mg/dL   Nitrite NEGATIVE   NEGATIVE   Leukocytes, UA MODERATE (*) NEGATIVE  URINE MICROSCOPIC-ADD ON     Status: Abnormal   Collection Time    08/25/13 12:28 PM      Result Value Range   WBC, UA 7-10  <3 WBC/hpf   RBC / HPF 11-20  <3 RBC/hpf   Bacteria, UA FEW (*) RARE   Casts HYALINE CASTS (*) NEGATIVE   Urine-Other MUCOUS PRESENT    BASIC METABOLIC PANEL     Status: Abnormal   Collection Time    08/26/13  4:32 AM      Result Value Range   Sodium 144  137 - 147 mEq/L   Comment: DELTA CHECK NOTED   Potassium 3.7  3.7 - 5.3 mEq/L   Chloride 104  96 - 112 mEq/L   Comment: DELTA CHECK NOTED   CO2 27  19 - 32 mEq/L   Glucose, Bld 148 (*) 70 - 99 mg/dL   BUN 13  6 - 23 mg/dL   Creatinine, Ser 0.69  0.50 - 1.35 mg/dL   Calcium 8.6  8.4 - 10.5 mg/dL   GFR calc non Af Amer 85 (*) >90 mL/min   GFR calc Af Amer >90  >90 mL/min   Comment: (NOTE)     The eGFR has been calculated using the CKD EPI equation.     This calculation has not been validated in all clinical situations.     eGFR's persistently <90 mL/min signify possible Chronic Kidney     Disease.  CBC     Status: Abnormal   Collection Time    08/26/13  4:32 AM      Result Value Range   WBC 9.0  4.0 - 10.5 K/uL   RBC 4.00 (*) 4.22 - 5.81 MIL/uL   Hemoglobin 11.4 (*) 13.0 - 17.0 g/dL   HCT 35.9 (*) 39.0 - 52.0 %   MCV 89.8  78.0 - 100.0 fL   MCH 28.5  26.0 - 34.0 pg   MCHC 31.8  30.0 - 36.0 g/dL   RDW 14.9  11.5 - 15.5 %   Platelets 228  150 - 400 K/uL    Imaging / Studies: Dg Abd Acute W/chest  08/25/2013   CLINICAL DATA:  Nausea and vomiting. Inguinal hernia repair 2 days ago.  EXAM: ACUTE ABDOMEN SERIES (ABDOMEN 2 VIEW & CHEST 1 VIEW)  COMPARISON:  Two-view chest 11/11/2007  FINDINGS: The heart size is normal. Lung volumes are low. The right hemidiaphragm is elevated.  Supine and upright views of the abdomen demonstrate dilated loops of small bowel measuring up to 5 cm. The decubitus images demonstrate multiple fluid levels. Gas and stool are  present throughout the colon. Degenerative changes of the lumbar spine are evident with mild leftward curvature. A small amount free air is present, within normal limits following laparoscopic surgery.  IMPRESSION: 1. Multiple dilated loops of small bowel with fluid levels suggesting a diffuse ileus. 2. A small amount of free air is likely within normal limits following laparoscopic surgery 2 days ago. 3. Low lung volumes with elevation of the right hemidiaphragm. This is nonspecific, but may be related to the recent surgery as well.   Electronically Signed   By: Lawrence Santiago M.D.   On: 08/25/2013 11:29    Medications / Allergies: per chart  Antibiotics: Anti-infectives   None       Note: This dictation was prepared with Dragon/digital dictation along with  Software engineer. Any transcriptional errors that result from this process are unintentional.

## 2013-08-26 NOTE — H&P (View-Only) (Signed)
Referring Provider: Dr. Horton Primary Care Physician:  GUEST, [name], MD Primary Gastroenterologist:  Unassigned  Reason for Consultation:  Hematemesis  HPI: Nicholas Herring is a 80 y.o. male who is s/p bilateral inguinal hernia 2 days ago and presented with acute onset of black vomitus that his wife says occurred 12 times last night. Vomiting initially was "black flecks" and then it was black fluid. In the ER it has been dark bilious fluid. Denies any bright red blood in the vomitus and denies any melena or hematochezia. He has been having abdominal discomfort since surgery and poor appetite. Abdominal pain is periumbilical. No BMs yesterday.    Past Medical History  Diagnosis Date  . Incarcerated incisional hernia 2009  . Difficulty in urination   . Macular degeneration     Right eye  . Colon polyps   . Seborrheic keratoses, inflamed 2013  . Diverticulosis of sigmoid colon   . Osteoarthritis of both knees 08/17/2013  . Acute urinary retention 2009    Resolved after foley & flomax  . Enlarged prostate   . Sleep apnea     pt has CPAP machine but does not wear at bedtime    Past Surgical History  Procedure Laterality Date  . Cholecystectomy  1987  . Laparoscopic assisted ventral hernia repair  2009    Dr Newman  . Stomach surgery  1990    reflux surgery?  . Inguinal hernia repair Right infant  . Cataract extraction Left   . Inguinal hernia repair Bilateral 08/23/2013    Procedure: LAPAROSCOPIC Bilateral INGUINAL HERNIA Repairs and TAPP block    ;  Surgeon: Steven C. Gross, MD;  Location: [facility] OR;  Service: General;  Laterality: Bilateral;  . Insertion of mesh Bilateral 08/23/2013    Procedure: INSERTION OF MESH ;  Surgeon: Steven C. Gross, MD;  Location: [facility] OR;  Service: General;  Laterality: Bilateral;  . Laparoscopic lysis of adhesions N/A 08/23/2013    Procedure: LAPAROSCOPIC LYSIS OF ADHESIONS;  Surgeon: Steven C. Gross, MD;  Location: [facility] OR;  Service: General;   Laterality: N/A;    Prior to Admission medications   Medication Sig Start Date End Date Taking? Authorizing Provider  calcium carbonate (TUMS EX) 750 MG chewable tablet Chew 1 tablet by mouth daily.   Yes Historical Provider, MD  diphenhydrAMINE (BENADRYL) 25 MG tablet Take 25 mg by mouth every 6 (six) hours as needed.   Yes Historical Provider, MD  naproxen sodium (ANAPROX) 220 MG tablet Take 220 mg by mouth 2 (two) times daily with a meal.   Yes Historical Provider, MD  oxyCODONE (OXY IR/ROXICODONE) 5 MG immediate release tablet Take 1-2 tablets (5-10 mg total) by mouth every 6 (six) hours as needed for moderate pain, severe pain or breakthrough pain. 08/23/13  Yes Steven C. Gross, MD  tamsulosin (FLOMAX) 0.4 MG CAPS capsule Take 1 capsule (0.4 mg total) by mouth daily. 08/17/13  Yes Steven C. Gross, MD    Scheduled Meds: Continuous Infusions: PRN Meds:.    Allergies as of 08/25/2013 - Review Complete 08/25/2013  Allergen Reaction Noted  . Morphine and related Nausea And Vomiting 02/01/2013    History reviewed. No pertinent family history.  History   Social History  . Marital Status: Married    Spouse Name: N/A    Number of Children: N/A  . Years of Education: N/A   Occupational History  . Retired   . Lawyer/Judge    Social History Main Topics  . Smoking   status: Former Smoker    Quit date: 08/12/1971  . Smokeless tobacco: Never Used  . Alcohol Use: 3.6 oz/week    6 Cans of beer per week     Comment: weekly  . Drug Use: No  . Sexual Activity: No   Other Topics Concern  . Not on file   Social History Narrative   Lives with his wife.  [employer] graduate, class of '52.    Review of Systems: All negative from a GI standpoint except as stated above in HPI.  Physical Exam: Vital signs: Filed Vitals:   08/25/13 1346  BP: 143/72  Pulse: 97  Temp: 98.8 F (37.1 C)  Resp: 17     General:   Elderly, frail, Alert, No acute distress HEENT: anicteric Lungs:   Clear throughout to auscultation.   No wheezes, crackles, or rhonchi. No acute distress. Heart:  Regular rate and rhythm; no murmurs, clicks, rubs,  or gallops. Abdomen: diffusely tender with guarding, lap port sites bandaged, soft, mild distention, hypoactive bowel sounds  Rectal:  Deferred Ext: no edema  GI:  Lab Results:  Recent Labs  08/25/13 1035  WBC 11.4*  HGB 12.7*  HCT 37.8*  PLT 250   BMET  Recent Labs  08/25/13 1035  NA 136*  K 4.1  CL 95*  CO2 26  GLUCOSE 174*  BUN 15  CREATININE 0.77  CALCIUM 9.3   LFT  Recent Labs  08/25/13 1035  PROT 7.1  ALBUMIN 3.4*  AST 22  ALT 12  ALKPHOS 139*  BILITOT 0.7   PT/INR No results found for this basename: LABPROT, INR,  in the last 72 hours   Studies/Results: Dg Abd Acute W/chest  08/25/2013   CLINICAL DATA:  Nausea and vomiting. Inguinal hernia repair 2 days ago.  EXAM: ACUTE ABDOMEN SERIES (ABDOMEN 2 VIEW & CHEST 1 VIEW)  COMPARISON:  Two-view chest 11/11/2007  FINDINGS: The heart size is normal. Lung volumes are low. The right hemidiaphragm is elevated.  Supine and upright views of the abdomen demonstrate dilated loops of small bowel measuring up to 5 cm. The decubitus images demonstrate multiple fluid levels. Gas and stool are present throughout the colon. Degenerative changes of the lumbar spine are evident with mild leftward curvature. A small amount free air is present, within normal limits following laparoscopic surgery.  IMPRESSION: 1. Multiple dilated loops of small bowel with fluid levels suggesting a diffuse ileus. 2. A small amount of free air is likely within normal limits following laparoscopic surgery 2 days ago. 3. Low lung volumes with elevation of the right hemidiaphragm. This is nonspecific, but may be related to the recent surgery as well.   Electronically Signed   By: Chris  Mattern M.D.   On: 08/25/2013 11:29    Impression/Plan: 80 yo with diffuse ileus who has been having multiple episodes of  coffee ground emesis. Hemodynamically stable with Hgb 12.7. DDx erosive esophagitis vs. Peptic ulcer disease vs. Mallory-Weiss tear (less likely) vs. Mucosal bleeding. Protonix drip. EGD tomorrow. Keep NPO. Ileus management per surgery.    LOS: 0 days   Lisa Milian C.  08/25/2013, 2:25 PM   

## 2013-08-26 NOTE — Interval H&P Note (Signed)
History and Physical Interval Note:  08/26/2013 9:44 AM  Nicholas Herring  has presented today for surgery, with the diagnosis of gib  The various methods of treatment have been discussed with the patient and family. After consideration of risks, benefits and other options for treatment, the patient has consented to  Procedure(s): ESOPHAGOGASTRODUODENOSCOPY (EGD) (N/A) as a surgical intervention .  The patient's history has been reviewed, patient examined, no change in status, stable for surgery.  I have reviewed the patient's chart and labs.  Questions were answered to the patient's satisfaction.     [name] C.

## 2013-08-26 NOTE — Progress Notes (Signed)
Patient ID: Lucille PassyJackson B Bodine, male   DOB: 12/31/1926, 80 y.o.   MRN: [id number]  EGD showed severe erosive esophagitis (s/p biopsy and brushing). Large amount of dark green bile in stomach and examined duodenum concerning for SBO. Would avoid NG tube unless vomiting recurs and persists. Keep NPO and defer changing to CCS. D/W Dr. Michaell CowingGross. See endopro note for details. Dr. Matthias HughsBuccini to see this weekend.

## 2013-08-26 NOTE — Care Management Note (Signed)
    Page 1 of 1   08/26/2013     12:52:03 PM   CARE MANAGEMENT NOTE 08/26/2013  Patient:  Lucille PassyMORTON,Barclay B   Account Number:  [id number]  Date Initiated:  08/26/2013  Documentation initiated by:  Lorenda IshiharaPEELE,Bonny Egger  Subjective/Objective Assessment:   80 yo male admitted with nausea and vomiting, coffee ground emesis. PTA lived at home with spouse.     Action/Plan:   Home when stable   Anticipated DC Date:  08/28/2013   Anticipated DC Plan:  HOME/SELF CARE      DC Planning Services  CM consult      Choice offered to / List presented to:             Status of service:  Completed, signed off Medicare Important Message given?   (If response is "NO", the following Medicare IM given date fields will be blank) Date Medicare IM given:   Date Additional Medicare IM given:    Discharge Disposition:  HOME/SELF CARE  Per UR Regulation:  Reviewed for med. necessity/level of care/duration of stay  If discussed at Long Length of Stay Meetings, dates discussed:    Comments:

## 2013-08-27 ENCOUNTER — Inpatient Hospital Stay (HOSPITAL_COMMUNITY): Payer: Medicare Other

## 2013-08-27 DIAGNOSIS — K209 Esophagitis, unspecified without bleeding: Secondary | ICD-10-CM | POA: Diagnosis present

## 2013-08-27 DIAGNOSIS — R339 Retention of urine, unspecified: Secondary | ICD-10-CM

## 2013-08-27 LAB — CBC
HCT: 42.6 % (ref 39.0–52.0)
Hemoglobin: 14 g/dL (ref 13.0–17.0)
MCH: 29.4 pg (ref 26.0–34.0)
MCHC: 32.9 g/dL (ref 30.0–36.0)
MCV: 89.5 fL (ref 78.0–100.0)
PLATELETS: 283 10*3/uL (ref 150–400)
RBC: 4.76 MIL/uL (ref 4.22–5.81)
RDW: 14.9 % (ref 11.5–15.5)
WBC: 11.8 10*3/uL — ABNORMAL HIGH (ref 4.0–10.5)

## 2013-08-27 LAB — BASIC METABOLIC PANEL
BUN: 20 mg/dL (ref 6–23)
CALCIUM: 9 mg/dL (ref 8.4–10.5)
CO2: 28 mEq/L (ref 19–32)
Chloride: 100 mEq/L (ref 96–112)
Creatinine, Ser: 0.71 mg/dL (ref 0.50–1.35)
GFR calc non Af Amer: 84 mL/min — ABNORMAL LOW (ref >90)
Glucose, Bld: 152 mg/dL — ABNORMAL HIGH (ref 70–99)
Potassium: 3.5 mEq/L — ABNORMAL LOW (ref 3.7–5.3)
Sodium: 142 mEq/L (ref 137–147)

## 2013-08-27 NOTE — Progress Notes (Signed)
Soap suds enema performed, pt tolerated well.  No results from enema, except small amount of mucus and 1 marble size pellet of stool.  Pt did void 1 occurrence when releasing enema.

## 2013-08-27 NOTE — Progress Notes (Signed)
Surgeon's note reviewed. Agree. It is unclear whether this patient has an ileus or a low-grade bowel obstruction, but in either case, observation is reasonable. Updated radiographic assessment may be helpful.   I do feel that an NG tube would be okay if the patient is having regurgitation or vomiting. While it is true that an NG tube can act as a "wick" that would cause acid to track up in the esophagus, and/or it could cause further irritation to an already-irritated esophageal mucosa, in balance it may do more good than harm by keeping the stomach empty and there by reducing acid reflux, not to mention decompressing the small intestine which may be helpful if either an ileus or an obstruction is present.  I would also make an appointment to keep the patient's potassium above 4, if possible.  I will be on standby if I can be of further assistance in the care of this patient. I will plan to sign off at this time, but would be happy to see the patient again if you feel our input would be helpful.  Florencia Reasonsobert V. Alexandru Moorer, M.D. [phone]

## 2013-08-27 NOTE — Progress Notes (Signed)
Dr. Michaell CowingGross called in regards to abdominal xray and results read: MD states we need to avoid NG tube placement due to pt's persistent esophageal irration.  However, if pt vomits again, orders to place NG tube to LIWS received.  Orders for 1 time soap suds enema received.

## 2013-08-27 NOTE — Progress Notes (Signed)
Nicholas Herring [id number] Jun 03, 1927  CARE TEAM:  PCP: Nicholas Portela, MD  Outpatient Care Team: Patient Care Team: Nicholas Flaming, MD as PCP - General (Internal Medicine) Nicholas Genta, MD as Consulting Physician (Gastroenterology) Nicholas Balls, MD as Consulting Physician (Orthopedic Surgery)  Inpatient Treatment Team: Treatment Team: Attending Provider: Adin Hector, MD; Consulting Physician: Nicholas Ng, MD; Consulting Physician: Nicholas Hector, MD; Technician: Nicholas Herring, NT; Registered Nurse: Nicholas Reeve, RN; Technician: Nicholas Herring, NT; Registered Nurse: Nicholas Primus, RN; Technician: Nicholas Herring, NT   Subjective:  The patient feeling better.  According to the nurse the patient had an episode of vomiting last night.  The patient does not recall this.  The patient does not recall walking.  Her says he has been up walking in the hallways.Had one episode of mild retching last night but much improved.   Objective:  Vital signs:  Filed Vitals:   08/26/13 1900 08/26/13 2156 08/27/13 0151 08/27/13 0539  BP:  148/81 136/69 132/79  Pulse:  80 87 96  Temp:  98 F (36.7 C) 97.7 F (36.5 C) 97.7 F (36.5 C)  TempSrc:  Oral Oral Oral  Resp:  $Remo'18 18 16  'mMRvz$ Height: 6' (1.829 m)     Weight: 181 lb 6.4 oz (82.283 kg)     SpO2:  94% 94% 92%    Last BM Date: 08/26/13  Intake/Output   Yesterday:  01/16 0701 - 01/17 0700 In: 622.8 [I.V.:622.8] Out: 1700 [Urine:850; Emesis/Herring output:150] This shift:  Total I/O In: 240 [P.O.:240] Out: 100 [Urine:100]  Bowel function:  Flatus: y  BM: no   Physical Exam:  General: Pt awake/alert/oriented x4 in no acute distress Eyes: PERRL, normal EOM.  Sclera clear.  No icterus Neuro: CN II-XII intact w/o focal sensory/motor deficits. Lymph: No head/neck/groin lymphadenopathy Psych:  No delerium/psychosis/paranoia.  Tends to interrupt to ask questions. HENT: Normocephalic, Mucus membranes  moist.  No thrush Neck: Supple, No tracheal deviation Chest: No chest wall pain w good excursion CV:  Pulses intact.  Regular rhythm MS: Normal AROM mjr joints.  No obvious deformity Abdomen: Soft.  MODERATELY distended.  Nontender at incisions.  No evidence of peritonitis.  No incarcerated hernias. Ext:  SCDs BLE.  No mjr edema.  No cyanosis Skin: No petechiae / purpura   Problem List:   Principal Problem:   Hematemesis Active Problems:   Difficulty in urination   Recurrent bilateral inguinal hernia (BIH) s/p lap repair w mesh 08/23/2013   Ileus, postoperative   Melena   Assessment  Nicholas Herring  80 y.o. male  1 Day Post-Op  Procedure(s): ESOPHAGOGASTRODUODENOSCOPY (EGD)  ILEUS, possible PSBO  Plan:  Sips only.  NGT PRN  Check Xrays.  Enema if still has significant stool burden  Foley out.  I&O when necessary.  If need recurrent Foley, urology consult.  Continue continuous IV Protonix.  Stop using any nonsteroidals.  Tylenol only for pain.  Bowel regimen to avoid further episodes of constipation/ileus. once ileus/psbo resolved  -VTE prophylaxis- SCDs, etc  -mild confusion/?dementia.  At baseline.  follow  -mobilize as tolerated to help recovery  I updated the patient's status to the patient & RN.  Recommendations were made.  Questions were answered.  The patient expressed understanding & appreciation.   Nicholas Herring, M.D., F.A.C.S. Gastrointestinal and Minimally Invasive Surgery [facility] [address] [place], [place] [phone] Main / Paging  08/27/2013   Results:   Labs: Results for orders placed during the hospital encounter of 08/25/13 (from the past 48 hour(s))  URINALYSIS, ROUTINE W REFLEX MICROSCOPIC     Status: Abnormal   Collection Time    08/25/13 12:28 PM      Result Value Range   Color, Urine AMBER (*) YELLOW   Comment: BIOCHEMICALS MAY BE AFFECTED BY COLOR   APPearance CLOUDY  (*) CLEAR   Specific Gravity, Urine 1.025  1.005 - 1.030   pH 6.0  5.0 - 8.0   Glucose, UA NEGATIVE  NEGATIVE mg/dL   Hgb urine dipstick LARGE (*) NEGATIVE   Bilirubin Urine MODERATE (*) NEGATIVE   Ketones, ur >80 (*) NEGATIVE mg/dL   Protein, ur 30 (*) NEGATIVE mg/dL   Urobilinogen, UA 1.0  0.0 - 1.0 mg/dL   Nitrite NEGATIVE  NEGATIVE   Leukocytes, UA MODERATE (*) NEGATIVE  URINE CULTURE     Status: None   Collection Time    08/25/13 12:28 PM      Result Value Range   Specimen Description URINE, CLEAN CATCH     Special Requests NONE     Culture  Setup Time       Value: 08/25/2013 17:03     Performed at [facility]       Value: NO GROWTH     Performed at [facility]   Culture       Value: NO GROWTH     Performed at [facility]   Report Status 08/26/2013 FINAL    URINE MICROSCOPIC-ADD ON     Status: Abnormal   Collection Time    08/25/13 12:28 PM      Result Value Range   WBC, UA 7-10  <3 WBC/hpf   RBC / HPF 11-20  <3 RBC/hpf   Bacteria, UA FEW (*) RARE   Casts HYALINE CASTS (*) NEGATIVE   Urine-Other MUCOUS PRESENT    BASIC METABOLIC PANEL     Status: Abnormal   Collection Time    08/26/13  4:32 AM      Result Value Range   Sodium 144  137 - 147 mEq/L   Comment: DELTA CHECK NOTED   Potassium 3.7  3.7 - 5.3 mEq/L   Chloride 104  96 - 112 mEq/L   Comment: DELTA CHECK NOTED   CO2 27  19 - 32 mEq/L   Glucose, Bld 148 (*) 70 - 99 mg/dL   BUN 13  6 - 23 mg/dL   Creatinine, Ser 0.69  0.50 - 1.35 mg/dL   Calcium 8.6  8.4 - 10.5 mg/dL   GFR calc non Af Amer 85 (*) >90 mL/min   GFR calc Af Amer >90  >90 mL/min   Comment: (NOTE)     The eGFR has been calculated using the CKD EPI equation.     This calculation has not been validated in all clinical situations.     eGFR's persistently <90 mL/min signify possible Chronic Kidney     Disease.  CBC     Status: Abnormal   Collection Time    08/26/13  4:32 AM      Result Value Range    WBC 9.0  4.0 - 10.5 K/uL   RBC 4.00 (*) 4.22 - 5.81 MIL/uL   Hemoglobin 11.4 (*) 13.0 - 17.0 g/dL   HCT 35.9 (*) 39.0 - 52.0 %   MCV 89.8  78.0 - 100.0 fL   MCH  28.5  26.0 - 34.0 pg   MCHC 31.8  30.0 - 36.0 g/dL   RDW 14.9  11.5 - 15.5 %   Platelets 228  150 - 400 K/uL    Imaging / Studies: No results found.  Medications / Allergies: per chart  Antibiotics: Anti-infectives   None       Note: This dictation was prepared with Dragon/digital dictation along with [employer]. Any transcriptional errors that result from this process are unintentional.

## 2013-08-28 MED ORDER — BISACODYL 10 MG RE SUPP
10.0000 mg | Freq: Every day | RECTAL | Status: DC
  Administered 2013-08-28 – 2013-08-29 (×2): 10 mg via RECTAL
  Filled 2013-08-28 (×2): qty 1

## 2013-08-28 MED ORDER — LACTATED RINGERS IV BOLUS (SEPSIS)
1000.0000 mL | Freq: Three times a day (TID) | INTRAVENOUS | Status: DC | PRN
  Administered 2013-08-28: 1000 mL via INTRAVENOUS

## 2013-08-28 MED ORDER — METOCLOPRAMIDE HCL 5 MG/ML IJ SOLN: 5.0000 mg | Freq: Four times a day (QID) | INTRAMUSCULAR | Status: DC | PRN

## 2013-08-28 NOTE — Progress Notes (Addendum)
Nicholas Herring [id number] 1926-09-24  CARE TEAM:  PCP: Kennon Portela, MD  Outpatient Care Team: Patient Care Team: Orma Flaming, MD as PCP - General (Internal Medicine) Mayme Genta, MD as Consulting Physician (Gastroenterology) Almedia Balls, MD as Consulting Physician (Orthopedic Surgery)  Inpatient Treatment Team: Treatment Team: Attending Provider: Adin Hector, MD; Consulting Physician: Lear Ng, MD; Consulting Physician: Adin Hector, MD; Technician: Delila Spence, NT; Registered Nurse: Emeterio Reeve, RN; Technician: Evalina Field, NT; Registered Nurse: Rickey Primus, RN; Technician: Senaida Ores Debbink, NT   Subjective:  The patient feeling better.    No nausea/vomiting.  Not urinating - Foley replaced.  ?Walking   Objective:  Vital signs:  Filed Vitals:   08/27/13 0539 08/27/13 1400 08/27/13 2059 08/28/13 0516  BP: 132/79 130/78 117/72 123/72  Pulse: 96 97 95 88  Temp: 97.7 F (36.5 C) 98.1 F (36.7 C) 98.2 F (36.8 C) 97.9 F (36.6 C)  TempSrc: Oral Oral Oral Oral  Resp: $Remo'16 16 18 16  'lmQtM$ Height:      Weight:      SpO2: 92% 93% 93% 92%    Last BM Date: 08/26/13  Intake/Output   Yesterday:  01/17 0701 - 01/18 0700 In: 2033.6 [P.O.:570; I.V.:1463.6] Out: 325 [Urine:325] This shift:  Total I/O In: 1225 [P.O.:30; I.V.:195; IV Piggyback:1000] Out: 149 [Urine:149]  Bowel function:  Flatus: yes - much more  BM: no   Physical Exam:  General: Pt awake/alert/oriented x4 in no acute distress Eyes: PERRL, normal EOM.  Sclera clear.  No icterus Neuro: CN II-XII intact w/o focal sensory/motor deficits. Lymph: No head/neck/groin lymphadenopathy Psych:  No delerium/psychosis/paranoia.  Tends to interrupt to ask questions. HENT: Normocephalic, Mucus membranes moist.  No thrush Neck: Supple, No tracheal deviation Chest: No chest wall pain w good excursion CV:  Pulses intact.  Regular rhythm MS: Normal AROM mjr  joints.  No obvious deformity Abdomen: Softer.  MODERATELY distended - a little better.  Nontender at incisions.  No evidence of peritonitis.  No incarcerated hernias. Ext:  SCDs BLE.  No mjr edema.  No cyanosis Skin: No petechiae / purpura   Problem List:   Principal Problem:   Hematemesis Active Problems:   Difficulty in urination   Recurrent bilateral inguinal hernia (BIH) s/p lap repair w mesh 08/23/2013   Ileus, postoperative   Melena   Acute esophagitis   Assessment  Nicholas Herring  80 y.o. male  2 Days Post-Op  Procedure(s): ESOPHAGOGASTRODUODENOSCOPY (EGD)  ILEUS, possible PSBO  Plan:  Sips only.  NGT PRN.  Try clears if better tomorrow.  If worsens or does not improve after NGT x 1-2 days: OR   Check Xrays in AM.  Enema if still has significant stool burden  Recurrent Foley, urology consult outpatient followup  Continue continuous IV Protonix for severe erosive esophagitis.  Stop using any nonsteroidals.  Tylenol only for pain.  Bowel regimen to avoid further episodes of constipation/ileus. once ileus/psbo resolved  -VTE prophylaxis- SCDs, etc  -mild confusion/?dementia.  At baseline.  follow  -mobilize as tolerated to help recovery  I updated the patient's status to the patient & his wife.  Recommendations were made.  Questions were answered.  The patient expressed understanding & appreciation.   Adin Hector, M.D., F.A.C.S. Gastrointestinal and Minimally Invasive Surgery [facility] [address] [place], [place] [phone] Main / Paging   08/28/2013   Results:  Labs: Results for orders placed during the hospital encounter of 08/25/13 (from the past 48 hour(s))  CBC     Status: Abnormal   Collection Time    08/27/13  1:03 PM      Result Value Range   WBC 11.8 (*) 4.0 - 10.5 K/uL   RBC 4.76  4.22 - 5.81 MIL/uL   Hemoglobin 14.0  13.0 - 17.0 g/dL   Comment: RESULT REPEATED AND VERIFIED      DELTA CHECK NOTED   HCT 42.6  39.0 - 52.0 %   MCV 89.5  78.0 - 100.0 fL   MCH 29.4  26.0 - 34.0 pg   MCHC 32.9  30.0 - 36.0 g/dL   RDW 14.9  11.5 - 15.5 %   Platelets 283  150 - 400 K/uL  BASIC METABOLIC PANEL     Status: Abnormal   Collection Time    08/27/13  1:03 PM      Result Value Range   Sodium 142  137 - 147 mEq/L   Potassium 3.5 (*) 3.7 - 5.3 mEq/L   Chloride 100  96 - 112 mEq/L   CO2 28  19 - 32 mEq/L   Glucose, Bld 152 (*) 70 - 99 mg/dL   BUN 20  6 - 23 mg/dL   Creatinine, Ser 0.71  0.50 - 1.35 mg/dL   Calcium 9.0  8.4 - 10.5 mg/dL   GFR calc non Af Amer 84 (*) >90 mL/min   GFR calc Af Amer >90  >90 mL/min   Comment: (NOTE)     The eGFR has been calculated using the CKD EPI equation.     This calculation has not been validated in all clinical situations.     eGFR's persistently <90 mL/min signify possible Chronic Kidney     Disease.    Imaging / Studies: Dg Abd Acute W/chest  08/27/2013   CLINICAL DATA:  Abdominal pain, suspect small bowel obstruction  EXAM: ACUTE ABDOMEN SERIES (ABDOMEN 2 VIEW & CHEST 1 VIEW)  COMPARISON:  Acute abdominal series of August 25, 2013  FINDINGS: The lung volumes remain low. There remain coarse interstitial markings in the left mid and lower rib hemi thorax. There is no pleural effusion or pulmonary vascular congestion. There is marked tortuosity of the ascending and descending thoracic aorta.  Within the abdomen there remain numerous loops of air and fluid-filled small bowel with the maximal diameter of a distended small bowel loop to the left of the lumbar spine being 6.8 cm. There is a normal stool and gas pattern within the colon and rectum. No free extraluminal gas collections are demonstrated. There is diffuse osteopenia of the bony structures. There are surgical clips in the gallbladder fossa. The patient has undergone previous abdominal wall repair with multiple coronal clips present.  IMPRESSION: 1. The findings are consistent with a  persistent distal small-bowel obstruction versus severe ileus. A small amount of extraluminal gas persists. 2. The lungs remain hypoinflated bilaterally. There is likely subsegmental atelectasis at the left lung base. 3. Nasogastric suction may be useful.   Electronically Signed   By: David  Martinique   On: 08/27/2013 15:42    Medications / Allergies: per chart  Antibiotics: Anti-infectives   None       Note: This dictation was prepared with Dragon/digital dictation along with Smart[employer] technology. Any transcriptional errors that result from this process are unintentional.

## 2013-08-29 ENCOUNTER — Encounter (HOSPITAL_COMMUNITY): Payer: Self-pay | Admitting: Gastroenterology

## 2013-08-29 ENCOUNTER — Inpatient Hospital Stay (HOSPITAL_COMMUNITY): Payer: Medicare Other

## 2013-08-29 NOTE — Progress Notes (Signed)
Nicholas Herring [id number] Dec 28, 1926  CARE TEAM:  PCP: Kennon Portela, MD  Outpatient Care Team: Patient Care Team: Orma Flaming, MD as PCP - General (Internal Medicine) Mayme Genta, MD as Consulting Physician (Gastroenterology) Almedia Balls, MD as Consulting Physician (Orthopedic Surgery)  Inpatient Treatment Team: Treatment Team: Attending Provider: Adin Hector, MD; Consulting Physician: Lear Ng, MD; Consulting Physician: Adin Hector, MD; Technician: Delila Spence, NT; Registered Nurse: Emeterio Reeve, RN; Technician: Evalina Field, NT; Technician: Rondel Jumbo, NT; Registered Nurse: Carilyn Goodpasture, RN   Subjective:  The patient feeling better.    No nausea/vomiting.  Had some bloating but refused nasogastric tube  Not urinating - Foley replaced.  ?Walking  Still feels mildly distended but began to have flatus and a bowel movement.  Felt even better later this morning.   Objective:  Vital signs:  Filed Vitals:   08/28/13 0516 08/28/13 1400 08/28/13 2046 08/29/13 0548  BP: 123/72 131/73 125/82 154/84  Pulse: 88 85 61 85  Temp: 97.9 F (36.6 C) 97.9 F (36.6 C) 98.3 F (36.8 C) 98 F (36.7 C)  TempSrc: Oral Oral Oral Oral  Resp: $Remo'16 18 16 16  'ePUbu$ Height:      Weight:      SpO2: 92% 93% 92% 95%    Last BM Date: 08/26/13  Intake/Output   Yesterday:  01/18 0701 - 01/19 0700 In: 2136.7 [P.O.:30; I.V.:1106.7; IV Piggyback:1000] Out: 1399 [Urine:1399] This shift:     Bowel function:  Flatus: yes - much more  BM: x1   Physical Exam:  General: Pt awake/alert/oriented x4 in no acute distress Eyes: PERRL, normal EOM.  Sclera clear.  No icterus Neuro: CN II-XII intact w/o focal sensory/motor deficits. Lymph: No head/neck/groin lymphadenopathy Psych:  No delerium/psychosis/paranoia.  Tends to interrupt to ask questions. HENT: Normocephalic, Mucus membranes moist.  No thrush Neck: Supple, No tracheal deviation Chest: No  chest wall pain w good excursion CV:  Pulses intact.  Regular rhythm MS: Normal AROM mjr joints.  No obvious deformity Abdomen: Softer.  MILDLY distended - a little better.  Nontender at incisions.  No evidence of peritonitis.  No incarcerated hernias. Ext:  SCDs BLE.  No mjr edema.  No cyanosis Skin: No petechiae / purpura   Problem List:   Principal Problem:   Hematemesis Active Problems:   Difficulty in urination   Recurrent bilateral inguinal hernia (BIH) s/p lap repair w mesh 08/23/2013   Ileus, postoperative   Melena   Acute esophagitis   Assessment  [name]  80 y.o. male  3 Days Post-Op  Procedure(s): ESOPHAGOGASTRODUODENOSCOPY (EGD)  ILEUS, possible PSBO, resolving  Plan:  Try clears Since x-rays look better, stomach less distended, and he feels a little better  Enema if still has significant stool burden  Recurrent Foley, urology consult outpatient followup  Continue continuous IV Protonix for severe erosive esophagitis.  Stop using any nonsteroidals.  Tylenol only for pain.  Bowel regimen to avoid further episodes of constipation/ileus once ileus/psbo resolved  -VTE prophylaxis- SCDs, etc  -mild confusion/?dementia.  At baseline.  follow  -mobilize as tolerated to help recovery  I updated the patient's status to the patient.  Recommendations were made.  Questions were answered.  The patient expressed understanding & appreciation.   Adin Hector, M.D., F.A.C.S. Gastrointestinal and Minimally Invasive Surgery [facility] [address], [place], [place] [address] [phone] Main / Paging   08/29/2013  Results:   Labs: Results for orders placed during the hospital encounter of 08/25/13 (from the past 48 hour(s))  CBC     Status: Abnormal   Collection Time    08/27/13  1:03 PM      Result Value Range   WBC 11.8 (*) 4.0 - 10.5 K/uL   RBC 4.76  4.22 - 5.81 MIL/uL   Hemoglobin 14.0  13.0 - 17.0 g/dL    Comment: RESULT REPEATED AND VERIFIED     DELTA CHECK NOTED   HCT 42.6  39.0 - 52.0 %   MCV 89.5  78.0 - 100.0 fL   MCH 29.4  26.0 - 34.0 pg   MCHC 32.9  30.0 - 36.0 g/dL   RDW 14.9  11.5 - 15.5 %   Platelets 283  150 - 400 K/uL  BASIC METABOLIC PANEL     Status: Abnormal   Collection Time    08/27/13  1:03 PM      Result Value Range   Sodium 142  137 - 147 mEq/L   Potassium 3.5 (*) 3.7 - 5.3 mEq/L   Chloride 100  96 - 112 mEq/L   CO2 28  19 - 32 mEq/L   Glucose, Bld 152 (*) 70 - 99 mg/dL   BUN 20  6 - 23 mg/dL   Creatinine, Ser 0.71  0.50 - 1.35 mg/dL   Calcium 9.0  8.4 - 10.5 mg/dL   GFR calc non Af Amer 84 (*) >90 mL/min   GFR calc Af Amer >90  >90 mL/min   Comment: (NOTE)     The eGFR has been calculated using the CKD EPI equation.     This calculation has not been validated in all clinical situations.     eGFR's persistently <90 mL/min signify possible Chronic Kidney     Disease.    Imaging / Studies: Dg Abd Acute W/chest  08/27/2013   CLINICAL DATA:  Abdominal pain, suspect small bowel obstruction  EXAM: ACUTE ABDOMEN SERIES (ABDOMEN 2 VIEW & CHEST 1 VIEW)  COMPARISON:  Acute abdominal series of August 25, 2013  FINDINGS: The lung volumes remain low. There remain coarse interstitial markings in the left mid and lower rib hemi thorax. There is no pleural effusion or pulmonary vascular congestion. There is marked tortuosity of the ascending and descending thoracic aorta.  Within the abdomen there remain numerous loops of air and fluid-filled small bowel with the maximal diameter of a distended small bowel loop to the left of the lumbar spine being 6.8 cm. There is a normal stool and gas pattern within the colon and rectum. No free extraluminal gas collections are demonstrated. There is diffuse osteopenia of the bony structures. There are surgical clips in the gallbladder fossa. The patient has undergone previous abdominal wall repair with multiple coronal clips present.   IMPRESSION: 1. The findings are consistent with a persistent distal small-bowel obstruction versus severe ileus. A small amount of extraluminal gas persists. 2. The lungs remain hypoinflated bilaterally. There is likely subsegmental atelectasis at the left lung base. 3. Nasogastric suction may be useful.   Electronically Signed   By: David  Martinique   On: 08/27/2013 15:42    Medications / Allergies: per chart  Antibiotics: Anti-infectives   None       Note: This dictation was prepared with Dragon/digital dictation along with Smartphrase technology. Any transcriptional errors that result from this process are unintentional.

## 2013-08-29 NOTE — Progress Notes (Signed)
Notified MD Gross that patient does not want the NG Tube currently refusing Stanford BreedBracey, Letina Luckett N 08-29-2012 10:17am

## 2013-08-30 MED ORDER — SODIUM CHLORIDE 0.9 % IJ SOLN
3.0000 mL | Freq: Two times a day (BID) | INTRAMUSCULAR | Status: DC
  Administered 2013-08-30: 3 mL via INTRAVENOUS

## 2013-08-30 MED ORDER — TAMSULOSIN HCL 0.4 MG PO CAPS
0.8000 mg | ORAL_CAPSULE | Freq: Every day | ORAL | Status: DC
  Administered 2013-08-30 – 2013-08-31 (×2): 0.8 mg via ORAL
  Filled 2013-08-30 (×2): qty 2

## 2013-08-30 MED ORDER — METOCLOPRAMIDE HCL 5 MG/ML IJ SOLN: 5.0000 mg | Freq: Four times a day (QID) | INTRAMUSCULAR | Status: AC | PRN

## 2013-08-30 MED ORDER — BISACODYL 10 MG RE SUPP: 10.0000 mg | Freq: Every day | RECTAL | Status: DC | PRN

## 2013-08-30 MED ORDER — POLYETHYLENE GLYCOL 3350 17 G PO PACK
17.0000 g | PACK | Freq: Every day | ORAL | Status: DC
  Filled 2013-08-30 (×2): qty 1

## 2013-08-30 MED ORDER — PANTOPRAZOLE SODIUM 40 MG PO TBEC
40.0000 mg | DELAYED_RELEASE_TABLET | Freq: Two times a day (BID) | ORAL | Status: DC
  Administered 2013-08-31: 40 mg via ORAL
  Filled 2013-08-30 (×3): qty 1

## 2013-08-30 MED ORDER — SODIUM CHLORIDE 0.9 % IJ SOLN: 3.0000 mL | INTRAMUSCULAR | Status: DC | PRN

## 2013-08-30 MED ORDER — LACTATED RINGERS IV BOLUS (SEPSIS): 1000.0000 mL | Freq: Three times a day (TID) | INTRAVENOUS | Status: DC | PRN

## 2013-08-30 NOTE — Progress Notes (Signed)
Nicholas Herring [id number] [birth year]  CARE TEAM:  PCP: Tally Due, MD  Outpatient Care Team: Patient Care Team: Jonita Albee, MD as PCP - General (Internal Medicine) Griffith Citron, MD as Consulting Physician (Gastroenterology) Lunette Stands, MD as Consulting Physician (Orthopedic Surgery)  Inpatient Treatment Team: Treatment Team: Attending Provider: Ardeth Sportsman, MD; Consulting Physician: Shirley Friar, MD; Consulting Physician: Ardeth Sportsman, MD; Technician: Nathanial Rancher, NT; Registered Nurse: Jonathon Bellows, RN; Technician: Mal Misty, NT; Technician: Harrie Jeans Debbink, NT   Subjective:  The patient feeling better.   "I had a big blow out yesterday" No nausea/vomiting.  Tol clears Wants Foley out Walking  Objective:  Vital signs:  Filed Vitals:   08/29/13 0548 08/29/13 1404 08/29/13 2206 08/30/13 0555  BP: 154/84 130/64 118/67 131/72  Pulse: 85 77 78 74  Temp: 98 F (36.7 C) 98.7 F (37.1 C) 98 F (36.7 C) 98 F (36.7 C)  TempSrc: Oral Oral Oral Oral  Resp: 16 20 20 18   Height:      Weight:      SpO2: 95% 95% 94% 94%    Last BM Date: 08/29/13  Intake/Output   Yesterday:  01/19 0701 - 01/20 0700 In: 3240 [P.O.:540; I.V.:2700] Out: 1450 [Urine:1450] This shift:  Total I/O In: 60 [P.O.:60] Out: -   Bowel function:  Flatus: yes - much more  BM: x3   Physical Exam:  General: Pt awake/alert/oriented x4 in no acute distress Eyes: PERRL, normal EOM.  Sclera clear.  No icterus Neuro: CN II-XII intact w/o focal sensory/motor deficits. Lymph: No head/neck/groin lymphadenopathy Psych:  No delerium/psychosis/paranoia.  Tends to interrupt to ask questions. HENT: Normocephalic, Mucus membranes moist.  No thrush Neck: Supple, No tracheal deviation Chest: No chest wall pain w good excursion CV:  Pulses intact.  Regular rhythm MS: Normal AROM mjr joints.  No obvious deformity Abdomen: Soft.  MILDLY distended - same.  Nontender  at incisions.  No evidence of peritonitis.  No incarcerated hernias. Ext:  SCDs BLE.  No mjr edema.  No cyanosis Skin: No petechiae / purpura   Problem List:   Principal Problem:   Hematemesis Active Problems:   Difficulty in urination   Recurrent bilateral inguinal hernia (BIH) s/p lap repair w mesh 08/23/2013   Ileus, postoperative   Melena   Acute esophagitis   Assessment  Nicholas Herring  80 y.o. male  4 Days Post-Op  Procedure(s): ESOPHAGOGASTRODUODENOSCOPY (EGD)  ILEUS, possible PSBO, resolving  Plan:  Adv to fulls since x-rays look better, stomach not more distended, and he feels better  Will try d/c foley one more time.  If recurrent Foley: urology consult outpatient followup  Continue protonix for severe erosive esophagitis.  Change to PO.  Stop using any nonsteroidals.  Tylenol only for pain.  Bowel regimen to avoid further episodes of constipation/ileus once ileus/psbo resolved  -VTE prophylaxis- SCDs, etc  -mild confusion/?dementia.  At baseline.  follow  -mobilize as tolerated to help recovery  I updated the patient's status to the patient.  Recommendations were made.  Questions were answered.  The patient expressed understanding & appreciation.   Ardeth Sportsman, M.D., F.A.C.S. Gastrointestinal and Minimally Invasive Surgery [facility] [address], [address] [place], [place] [address] [phone] Main / Paging   08/30/2013   Results:   Labs: No results found for this or any previous visit (from the past 48 hour(s)).  Imaging / Studies: Dg Abd  Acute W/chest  08/29/2013   CLINICAL DATA:  small bowel obstruction  EXAM: ACUTE ABDOMEN SERIES (ABDOMEN 2 VIEW & CHEST 1 VIEW)  COMPARISON:  08/27/2013  FINDINGS: Improving small bowel dilatation with small bowel air-fluid levels compared with the prior study. There is gas in nondilated colon. No free air. Prior ventral hernia repair with mesh. Prior cholecystectomy.  Mild  bibasilar atelectasis, slightly increased from the prior study. Negative for heart failure or effusion.  IMPRESSION: Slight increase in bibasilar atelectasis.  Improving small bowel obstruction.   Electronically Signed   By: Marlan Palauharles  Clark M.D.   On: 08/29/2013 09:58    Medications / Allergies: per chart  Antibiotics: Anti-infectives   None       Note: This dictation was prepared with Dragon/digital dictation along with [employer] technology. Any transcriptional errors that result from this process are unintentional.

## 2013-08-31 MED ORDER — PANTOPRAZOLE SODIUM 40 MG PO TBEC: 40.0000 mg | DELAYED_RELEASE_TABLET | Freq: Two times a day (BID) | ORAL | Status: DC

## 2013-08-31 NOTE — Discharge Instructions (Signed)
GETTING TO GOOD BOWEL HEALTH. Irregular bowel habits such as constipation and diarrhea can lead to many problems over time.  Having one soft bowel movement a day is the most important way to prevent further problems.  The anorectal canal is designed to handle stretching and feces to safely manage our ability to get rid of solid waste (feces, poop, stool) out of our body.  BUT, hard constipated stools can act like ripping concrete bricks and diarrhea can be a burning fire to this very sensitive area of our body, causing inflamed hemorrhoids, anal fissures, increasing risk is perirectal abscesses, abdominal pain/bloating, an making irritable bowel worse.     The goal: ONE SOFT BOWEL MOVEMENT A DAY!  To have soft, regular bowel movements:    Drink at least 8 tall glasses of water a day.     Take plenty of fiber.  Fiber is the undigested part of plant food that passes into the colon, acting s natures broom to encourage bowel motility and movement.  Fiber can absorb and hold large amounts of water. This results in a larger, bulkier stool, which is soft and easier to pass. Work gradually over several weeks up to 6 servings a day of fiber (25g a day even more if needed) in the form of: o Vegetables -- Root (potatoes, carrots, turnips), leafy green (lettuce, salad greens, celery, spinach), or cooked high residue (cabbage, broccoli, etc) o Fruit -- Fresh (unpeeled skin & pulp), Dried (prunes, apricots, cherries, etc ),  or stewed ( applesauce)  o Whole grain breads, pasta, etc (whole wheat)  o Bran cereals    Bulking Agents -- This type of water-retaining fiber generally is easily obtained each day by one of the following:  o Psyllium bran -- The psyllium plant is remarkable because its ground seeds can retain so much water. This product is available as Metamucil, Konsyl, Effersyllium, Per Diem Fiber, or the less expensive generic preparation in drug and health food stores. Although labeled a laxative, it really  is not a laxative.  o Methylcellulose -- This is another fiber derived from wood which also retains water. It is available as Citrucel. o Polyethylene Glycol - and artificial fiber commonly called Miralax or Glycolax.  It is helpful for people with gassy or bloated feelings with regular fiber o Flax Seed - a less gassy fiber than psyllium   No reading or other relaxing activity while on the toilet. If bowel movements take longer than 5 minutes, you are too constipated   AVOID CONSTIPATION.  High fiber and water intake usually takes care of this.  Sometimes a laxative is needed to stimulate more frequent bowel movements, but    Laxatives are not a good long-term solution as it can wear the colon out. o Osmotics (Milk of Magnesia, Fleets phosphosoda, Magnesium citrate, MiraLax, GoLytely) are safer than  o Stimulants (Senokot, Castor Oil, Dulcolax, Ex Lax)    o Do not take laxatives for more than 7days in a row.    IF SEVERELY CONSTIPATED, try a Bowel Retraining Program: o Do not use laxatives.  o Eat a diet high in roughage, such as bran cereals and leafy vegetables.  o Drink six (6) ounces of prune or apricot juice each morning.  o Eat two (2) large servings of stewed fruit each day.  o Take one (1) heaping tablespoon of a psyllium-based bulking agent twice a day. Use sugar-free sweetener when possible to avoid excessive calories.  o Eat a normal breakfast.  o  Set aside 15 minutes after breakfast to sit on the toilet, but do not strain to have a bowel movement.  o If you do not have a bowel movement by the third day, use an enema and repeat the above steps.    Controlling diarrhea o Switch to liquids and simpler foods for a few days to avoid stressing your intestines further. o Avoid dairy products (especially milk & ice cream) for a short time.  The intestines often can lose the ability to digest lactose when stressed. o Avoid foods that cause gassiness or bloating.  Typical foods include  beans and other legumes, cabbage, broccoli, and dairy foods.  Every person has some sensitivity to other foods, so listen to our body and avoid those foods that trigger problems for you. o Adding fiber (Citrucel, Metamucil, psyllium, Miralax) gradually can help thicken stools by absorbing excess fluid and retrain the intestines to act more normally.  Slowly increase the dose over a few weeks.  Too much fiber too soon can backfire and cause cramping & bloating. o Probiotics (such as active yogurt, Align, etc) may help repopulate the intestines and colon with normal bacteria and calm down a sensitive digestive tract.  Most studies show it to be of mild help, though, and such products can be costly. o Medicines:   Bismuth subsalicylate (ex. Kayopectate, Pepto Bismol) every 30 minutes for up to 6 doses can help control diarrhea.  Avoid if pregnant.   Loperamide (Immodium) can slow down diarrhea.  Start with two tablets (4mg  total) first and then try one tablet every 6 hours.  Avoid if you are having fevers or severe pain.  If you are not better or start feeling worse, stop all medicines and call your doctor for advice o Call your doctor if you are getting worse or not better.  Sometimes further testing (cultures, endoscopy, X-ray studies, bloodwork, etc) may be needed to help diagnose and treat the cause of the diarrhea.  CONTINUE PANTOPRAZOLE ANTACID MEDICATION TO HEAL THE ULCER IN YOUR ESOPHAGUS.  YOU WILL NEED TO SEE [facility] FOR FOLLOWUP AS WELL (DR. Bosie Clos)  Peptic Ulcer A peptic ulcer is a sore in the lining of in your esophagus (esophageal ulcer), stomach (gastric ulcer), or in the first part of your small intestine (duodenal ulcer). The ulcer causes erosion into the deeper tissue. CAUSES  Normally, the lining of the stomach and the small intestine protects itself from the acid that digests food. The protective lining can be damaged by:  An infection caused by a bacterium  called Helicobacter pylori (H. pylori).  Regular use of nonsteroidal anti-inflammatory drugs (NSAIDs), such as ibuprofen or aspirin.  Smoking tobacco. Other risk factors include being older than 50, drinking alcohol excessively, and having a family history of ulcer disease.  SYMPTOMS   Burning pain or gnawing in the area between the chest and the belly button.  Heartburn.  Nausea and vomiting.  Bloating. The pain can be worse on an empty stomach and at night. If the ulcer results in bleeding, it can cause:  Black, tarry stools.  Vomiting of bright red blood.  Vomiting of coffee ground looking materials. DIAGNOSIS  A diagnosis is usually made based upon your history and an exam. Other tests and procedures may be performed to find the cause of the ulcer. Finding a cause will help determine the best treatment. Tests and procedures may include:  Blood tests, stool tests, or breath tests to check for the bacterium H. pylori.  An upper gastrointestinal (GI) series of the esophagus, stomach, and small intestine.  An endoscopy to examine the esophagus, stomach, and small intestine.  A biopsy. TREATMENT  Treatment may include:  Eliminating the cause of the ulcer, such as smoking, NSAIDs, or alcohol.  Medicines to reduce the amount of acid in your digestive tract.  Antibiotic medicines if the ulcer is caused by the H. pylori bacterium.  An upper endoscopy to treat a bleeding ulcer.  Surgery if the bleeding is severe or if the ulcer created a hole somewhere in the digestive system. HOME CARE INSTRUCTIONS   Avoid tobacco, alcohol, and caffeine. Smoking can increase the acid in the stomach, and continued smoking will impair the healing of ulcers.  Avoid foods and drinks that seem to cause discomfort or aggravate your ulcer.  Only take medicines as directed by your caregiver. Do not substitute over-the-counter medicines for prescription medicines without talking to your  caregiver.  Keep any follow-up appointments and tests as directed. SEEK MEDICAL CARE IF:   Your do not improve within 7 days of starting treatment.  You have ongoing indigestion or heartburn. SEEK IMMEDIATE MEDICAL CARE IF:   You have sudden, sharp, or persistent abdominal pain.  You have bloody or dark black, tarry stools.  You vomit blood or vomit that looks like coffee grounds.  You become light headed, weak, or feel faint.  You become sweaty or clammy. MAKE SURE YOU:   Understand these instructions.  Will watch your condition.  Will get help right away if you are not doing well or get worse. Document Released: 07/25/2000 Document Revised: 04/21/2012 Document Reviewed: 02/25/2012 [facility] Patient Information 2014 [place], [place].   Gastrointestinal Bleeding Gastrointestinal (GI) bleeding means there is bleeding somewhere along the digestive tract, between the mouth and anus. CAUSES  There are many different problems that can cause GI bleeding. Possible causes include:  Esophagitis. This is inflammation, irritation, or swelling of the esophagus.  Hemorrhoids.These are veins that are full of blood (engorged) in the rectum. They cause pain, inflammation, and may bleed.  Anal fissures.These are areas of painful tearing which may bleed. They are often caused by passing hard stool.  Diverticulosis.These are pouches that form on the colon over time, with age, and may bleed significantly.  Diverticulitis.This is inflammation in areas with diverticulosis. It can cause pain, fever, and bloody stools, although bleeding is rare.  Polyps and cancer. Colon cancer often starts out as precancerous polyps.  Gastritis and ulcers.Bleeding from the upper gastrointestinal tract (near the stomach) may travel through the intestines and produce black, sometimes tarry, often bad smelling stools. In certain cases, if the bleeding is fast enough, the stools may not be black, but red. This  condition may be life-threatening. SYMPTOMS   Vomiting bright red blood or material that looks like coffee grounds.  Bloody, black, or tarry stools. DIAGNOSIS  Your caregiver may diagnose your condition by taking your history and performing a physical exam. More tests may be needed, including:  X-rays and other imaging tests.  Esophagogastroduodenoscopy (EGD). This test uses a flexible, lighted tube to look at your esophagus, stomach, and small intestine.  Colonoscopy. This test uses a flexible, lighted tube to look at your colon. TREATMENT  Treatment depends on the cause of your bleeding.   For bleeding from the esophagus, stomach, small intestine, or colon, the caregiver doing your EGD or colonoscopy may be able to stop the bleeding as part of the procedure.  Inflammation or infection of the colon  can be treated with medicines.  Many rectal problems can be treated with creams, suppositories, or warm baths.  Surgery is sometimes needed.  Blood transfusions are sometimes needed if you have lost a lot of blood. If bleeding is slow, you may be allowed to go home. If there is a lot of bleeding, you will need to stay in the hospital for observation. HOME CARE INSTRUCTIONS   Take any medicines exactly as prescribed.  Keep your stools soft by eating foods that are high in fiber. These foods include whole grains, legumes, fruits, and vegetables. Prunes (1 to 3 a day) work well for many people.  Drink enough fluids to keep your urine clear or pale yellow. SEEK IMMEDIATE MEDICAL CARE IF:   Your bleeding increases.  You feel lightheaded, weak, or you faint.  You have severe cramps in your back or abdomen.  You pass large blood clots in your stool.  Your problems are getting worse. MAKE SURE YOU:   Understand these instructions.  Will watch your condition.  Will get help right away if you are not doing well or get worse. Document Released: 07/25/2000 Document Revised:  07/14/2012 Document Reviewed: 07/07/2011 [facility] Patient Information 2014 [place], [place].  Ileus The intestine (bowel, or gut) is a long muscular tube connecting your stomach to your rectum. If the intestine stops working, food cannot pass through. This is called an ileus. This can happen for a variety of reasons. Ileus is a major medical problem that usually requires hospitalization. If your intestine stops working because of a blockage, this is called a bowel obstruction, and is a different condition. CAUSES   Surgery in your abdomen. This can last from a few hours to a few days.  An infection or inflammation in the belly (abdomen). This includes inflammation of the lining of the abdomen (peritonitis).  Infection or inflammation in other parts of the body, such as pneumonia or pancreatitis.  Passage of gallstones or kidney stones.  Damage to the nerves or blood vessels which go to the bowel.  Imbalance in the salts in the blood (electrolytes).  Injury to the brain and or spinal cord.  Medications. Many medications can cause ileus or make it worse. The most common of these are strong pain medications. SYMPTOMS  Symptoms of bowel obstruction come from the bowel inactivity. They may include:  Bloating. Your belly gets bigger (distension).  Pain or discomfort in the abdomen.  Poor appetite, feeling sick to your stomach (nausea) and vomiting.  You may also not be able to hear your normal bowel sounds, such as "growling" in your stomach. DIAGNOSIS   Your history and a physical exam will usually suggest to your caregiver that you have an ileus.  X-rays or a CT scan of your abdomen will confirm the diagnosis. X-rays, CT scans and lab tests may also suggest the cause. TREATMENT   Rest the intestine until it starts working again. This is most often accomplished by:  Stopping intake of oral food and drink. Dehydration is prevented by using IV (intravenous) fluids.  Sometimes, a  naso-gastric tube (NG tube) is needed. This is a narrow plastic tube inserted through your nose and into your stomach. It is connected to suction to keep the stomach emptied out. This also helps treat the nausea and vomiting.  If there is an imbalance in the electrolytes, they are corrected with supplements in your intravenous fluids.  Medications that might make an ileus worse might be stopped.  There are no medications  that reliably treat ileus, though your caregiver may suggest a trial of certain medications.  If your condition is slow to resolve, you will be re-evaluated to be sure another condition, such as a blockage, is not present. Ileus is common and usually has a good outcome. Depending on cause of your ileus, it usually can be treated by your caregivers with good results. Sometimes, specialists (surgeons or gastroenterologists) are asked to assist in your care.  HOME CARE INSTRUCTIONS   Follow your caregiver's instructions regarding diet and fluid intake. This will usually include drinking plenty of clear fluids, avoiding alcohol and caffeine, and eating a gentle diet.  Follow your caregiver's instructions regarding activity. A period of rest is sometimes advised before returning to work or school.  Take only medications prescribed by your caregiver. Be especially careful with narcotic pain medication, which can slow your bowel activity and contribute to ileus.  Keep any follow-up appointments with your caregiver or specialists. SEEK MEDICAL CARE IF:   You have a recurrence of nausea, vomiting or abdominal discomfort.  You develop fever of more than 102 F (38.9 C). SEEK IMMEDIATE MEDICAL CARE IF:   You have severe abdominal pain.  You are unable to keep fluids down. Document Released: 07/31/2003 Document Revised: 10/20/2011 Document Reviewed: 11/30/2008 [facility] Patient Information 2014 [place].

## 2013-08-31 NOTE — Discharge Summary (Signed)
Physician Discharge Summary  Patient ID: Nicholas Herring MRN: [id number] DOB/AGE: Apr 17, 1927 80 y.o.  Admit date: 09/13/2013 Discharge date: 08/31/2013  Patient Care Team: Jonita Albee, MD as PCP - General (Internal Medicine) Griffith Citron, MD as Consulting Physician (Gastroenterology) Lunette Stands, MD as Consulting Physician (Orthopedic Surgery)   Admission Diagnoses:  Discharge Diagnoses:  Principal Problem:   Hematemesis Active Problems:   Difficulty in urination   Recurrent bilateral inguinal hernia (BIH) s/p lap repair w mesh 08/23/2013   Ileus, postoperative   Melena   Acute esophagitis   Discharged Condition: good  Hospital Course: Pleasant elderly gentleman.  Undergone laparoscopic bilateral inguinal hernia repairs.  Developed nausea and vomiting with coffee ground emesis.  Admitted.  Placed on IV fluids and aggressive IV Protonix.  Gastroenterology consulted.  Endoscopy revealed severe esophagitis with ulceration.  Patient had intermittent nausea vomiting the first few days.  Intermittent flatus.  X-rays concerning for ileus vs. Partial small bowel obstruction.  Patient refused nasogastric tube.  Reroute his Foley catheter.  There is some question of difficulty with urination.  Therefore it was replaced.  By the end of discharge, Foley was removed and he was urinating fine on Flomax.  Later in his hospital stay he began to have markedly increased flatus and then numerous bowel movements.  The patient mobilized in the hallways and advanced to a solid diet gradually.  Pain was minimal and transitioned off IV medications.    By the time of discharge, the patient was walking well the hallways, eating food well, having flatus.  Pain was-controlled on an oral regimen.  Based on meeting DC criteria and recovering well, I felt it was safe for the patient to be discharged home with close followup.  Instructions were discussed in detail.  They are written as well.     Consults: GI  (Dr Bosie Clos, [facility])  Significant Diagnostic Studies:   Dg Abd Acute W/chest  09-13-2013   CLINICAL DATA:  Nausea and vomiting. Inguinal hernia repair 2 days ago.  EXAM: ACUTE ABDOMEN SERIES (ABDOMEN 2 VIEW & CHEST 1 VIEW)  COMPARISON:  Two-view chest 11/11/2007  FINDINGS: The heart size is normal. Lung volumes are low. The right hemidiaphragm is elevated.  Supine and upright views of the abdomen demonstrate dilated loops of small bowel measuring up to 5 cm. The decubitus images demonstrate multiple fluid levels. Gas and stool are present throughout the colon. Degenerative changes of the lumbar spine are evident with mild leftward curvature. A small amount free air is present, within normal limits following laparoscopic surgery.  IMPRESSION: 1. Multiple dilated loops of small bowel with fluid levels suggesting a diffuse ileus. 2. A small amount of free air is likely within normal limits following laparoscopic surgery 2 days ago. 3. Low lung volumes with elevation of the right hemidiaphragm. This is nonspecific, but may be related to the recent surgery as well.   Electronically Signed   By: Gennette Pac M.D.   On: 09/13/13 11:29   Treatments: EGD 08/26/2013 Dr Bosie Clos  Discharge Exam: Blood pressure 130/70, pulse 72, temperature 98.2 F (36.8 C), temperature source Oral, resp. rate 18, height 6' (1.829 m), weight 181 lb 6.4 oz (82.283 kg), SpO2 97.00%.  General: Pt awake/alert/oriented x4 in no major acute distress Eyes: PERRL, normal EOM. Sclera nonicteric Neuro: CN II-XII intact w/o focal sensory/motor deficits. Lymph: No head/neck/groin lymphadenopathy Psych:  No delerium/psychosis/paranoia HENT: Normocephalic, Mucus membranes moist.  No thrush Neck: Supple, No tracheal deviation Chest: No  pain.  Good respiratory excursion. CV:  Pulses intact.  Regular rhythm MS: Normal AROM mjr joints.  No obvious deformity Abdomen: Soft, Nondistended.  Nontender.  No incarcerated hernias.  Incisions  well healed.  No ecchymosis. GU:  NEMG.  No hematuria Ext:  SCDs BLE.  No significant edema.  No cyanosis Skin: No petechiae / purpura   Disposition: 01-Home or Self Care  Discharge Orders   Future Appointments Provider Department Dept Phone   09/09/2013 9:15 AM Ardeth SportsmanSteven C. Kilyn Maragh, MD [facility], [place] [phone]   Future Orders Complete By Expires   Call MD for:  extreme fatigue  As directed    Call MD for:  hives  As directed    Call MD for:  persistant nausea and vomiting  As directed    Call MD for:  redness, tenderness, or signs of infection (pain, swelling, redness, odor or green/yellow discharge around incision site)  As directed    Call MD for:  severe uncontrolled pain  As directed    Call MD for:  As directed    Comments:     Temperature > 101.22F   Diet - low sodium heart healthy  As directed    Discharge instructions  As directed    Comments:     Please see discharge instruction sheets.  Also refer to handout given an office.  Please call our office if you have any questions or concerns [phone]   Discharge wound care:  As directed    Comments:     If you have closed incisions, shower and bathe over these incisions with soap and water every day.  Remove all surgical dressings on postoperative day #3.  You do not need to replace dressings over the closed incisions unless you feel more comfortable with a Band-Aid covering it.   If you have an open wound that requires packing, please see wound care instructions.  In general, remove all dressings, wash wound with soap and water and then replace with saline moistened gauze.  Do the dressing change at least every day.  Please call our office [phone] if you have further questions.   Driving Restrictions  As directed    Comments:     No driving until off narcotics and can safely swerve away without pain during an emergency   Increase activity slowly  As directed    Comments:     Walk an hour a day.  Use 20-30  minute walks.  When you can walk 30 minutes without difficulty, increase to low impact/moderate activities such as biking, jogging, swimming, sexual activity..  Eventually can increase to unrestricted activity when not feeling pain.  If you feel pain: STOP!Marland Kitchen.   Let pain protect you from overdoing it.  Use ice/heat/over-the-counter pain medications to help minimize his soreness.  Use pain prescriptions as needed to remain active.  It is better to take extra pain medications and be more active than to stay bedridden to avoid all pain medications.   Lifting restrictions  As directed    Comments:     Avoid heavy lifting initially.  Do not push through pain.  You have no specific weight limit.  Coughing and sneezing or four more stressful to your incision than any lifting you will do. Pain will protect you from injury.  Therefore, avoid intense activity until off all narcotic pain medications.  Coughing and sneezing or four more stressful to your incision than any lifting he will do.   May shower /  Bathe  As directed    May walk up steps  As directed    Sexual Activity Restrictions  As directed    Comments:     Sexual activity as tolerated.  Do not push through pain.  Pain will protect you from injury.   Walk with assistance  As directed    Comments:     Walk over an hour a day.  May use a walker/cane/companion to help with balance and stamina.       Medication List    STOP taking these medications       naproxen sodium 220 MG tablet  Commonly known as:  ANAPROX      TAKE these medications       calcium carbonate 750 MG chewable tablet  Commonly known as:  TUMS EX  Chew 1 tablet by mouth daily.     diphenhydrAMINE 25 MG tablet  Commonly known as:  BENADRYL  Take 25 mg by mouth every 6 (six) hours as needed.     oxyCODONE 5 MG immediate release tablet  Commonly known as:  Oxy IR/ROXICODONE  Take 1-2 tablets (5-10 mg total) by mouth every 6 (six) hours as needed for moderate pain, severe  pain or breakthrough pain.     pantoprazole 40 MG tablet  Commonly known as:  PROTONIX  Take 1 tablet (40 mg total) by mouth 2 (two) times daily before a meal.     tamsulosin 0.4 MG Caps capsule  Commonly known as:  FLOMAX  Take 1 capsule (0.4 mg total) by mouth daily.           Follow-up Information   Follow up with Chade Pitner C., MD. Schedule an appointment as soon as possible for a visit in 2 weeks.   Specialty:  General Surgery   Contact information:   [address] [place] [address] [phone]       Follow up with Shirley Friar., MD. Schedule an appointment as soon as possible for a visit in 1 month. (to follow up on esophagitis/ulcer)    Specialty:  Gastroenterology   Contact information:   [address] [place] [address] [phone]       Signed: Ardeth Sportsman 08/31/2013, 7:19 AM

## 2013-08-31 NOTE — Progress Notes (Deleted)
Called to notify Physician Gross that patient says he is not able to go home today because his wife is very sick a stomach virus and she is unable to come and get patient, also patient's daughter just had surgery as well Stanford BreedBracey, Jeyren Danowski N RN 08-31-2012 12:30pm

## 2013-09-06 ENCOUNTER — Telehealth (INDEPENDENT_AMBULATORY_CARE_PROVIDER_SITE_OTHER): Payer: Self-pay | Admitting: General Surgery

## 2013-09-06 NOTE — Telephone Encounter (Signed)
Pt called to report area of "distention on his left side."  Upon questioning, the area is not tight, but soft and feels like fluid.  Explained the nature of swelling following hernia repair, and recommended he use a heating pad to dilate blood vessels and promote the reabsorption of the fluid.  He will call back if the area become tight and pain increased.  Also spoke with wife to convey the same information.  All questions answered and pt reassured.

## 2013-09-09 ENCOUNTER — Ambulatory Visit (INDEPENDENT_AMBULATORY_CARE_PROVIDER_SITE_OTHER): Payer: Medicare Other | Admitting: Surgery

## 2013-09-09 ENCOUNTER — Encounter (INDEPENDENT_AMBULATORY_CARE_PROVIDER_SITE_OTHER): Payer: Self-pay | Admitting: Surgery

## 2013-09-09 VITALS — BP 118/72 | HR 70 | Resp 18 | Ht 72.0 in | Wt 175.0 lb

## 2013-09-09 DIAGNOSIS — K4021 Bilateral inguinal hernia, without obstruction or gangrene, recurrent: Secondary | ICD-10-CM

## 2013-09-09 DIAGNOSIS — R3989 Other symptoms and signs involving the genitourinary system: Secondary | ICD-10-CM

## 2013-09-09 DIAGNOSIS — K209 Esophagitis, unspecified without bleeding: Secondary | ICD-10-CM

## 2013-09-09 DIAGNOSIS — R39198 Other difficulties with micturition: Secondary | ICD-10-CM

## 2013-09-09 NOTE — Patient Instructions (Signed)
HERNIA REPAIR: POST OP INSTRUCTIONS  1. DIET: Follow a light bland diet the first 24 hours after arrival home, such as soup, liquids, crackers, etc.  Be sure to include lots of fluids daily.  Avoid fast food or heavy meals as your are more likely to get nauseated.  Eat a low fat the next few days after surgery. 2. Take your usually prescribed home medications unless otherwise directed. 3. PAIN CONTROL: a. Pain is best controlled by a usual combination of three different methods TOGETHER: i. Ice/Heat ii. Over the counter pain medication iii. Prescription pain medication b. Most patients will experience some swelling and bruising around the hernia(s) such as the bellybutton, groins, or old incisions.  Ice packs or heating pads (30-60 minutes up to 6 times a day) will help. Use ice for the first few days to help decrease swelling and bruising, then switch to heat to help relax tight/sore spots and speed recovery.  Some people prefer to use ice alone, heat alone, alternating between ice & heat.  Experiment to what works for you.  Swelling and bruising can take several weeks to resolve.   c. It is helpful to take an over-the-counter pain medication regularly for the first few weeks.  Choose one of the following that works best for you: i. Naproxen (Aleve, etc)  Two 220mg  tabs twice a day ii. Ibuprofen (Advil, etc) Three 200mg  tabs four times a day (every meal & bedtime) iii. Acetaminophen (Tylenol, etc) 325-650mg  four times a day (every meal & bedtime) d. A  prescription for pain medication should be given to you upon discharge.  Take your pain medication as prescribed.  i. If you are having problems/concerns with the prescription medicine (does not control pain, nausea, vomiting, rash, itching, etc), please call us [phone] to see if we need to switch you to a different pain medicine that will work better for you and/or control your side effect better. ii. If you need a refill on your pain  medication, please contact your pharmacy.  They will contact our office to request authorization. Prescriptions will not be filled after 5 pm or on week-ends. 4. Avoid getting constipated.  Between the surgery and the pain medications, it is common to experience some constipation.  Increasing fluid intake and taking a fiber supplement (such as Metamucil, Citrucel, FiberCon, MiraLax, etc) 1-2 times a day regularly will usually help prevent this problem from occurring.  A mild laxative (prune juice, Milk of Magnesia, MiraLax, etc) should be taken according to package directions if there are no bowel movements after 48 hours.    Take Metamucil fiber supplement every day the rest of your life.  Adjust as needed.  Goal = one bowel movement a day 5. Wash / shower every day.  You may shower over the dressings as they are waterproof.   6. Remove your waterproof bandages 5 days after surgery.  You may leave the incision open to air.  You may replace a dressing/Band-Aid to cover the incision for comfort if you wish.  Continue to shower over incision(s) after the dressing is off.    7. ACTIVITIES as tolerated:   a. You may resume regular (light) daily activities beginning the next day-such as daily self-care, walking, climbing stairs-gradually increasing activities as tolerated.  If you can walk 30 minutes without difficulty, it is safe to try more intense activity such as jogging, treadmill, bicycling, low-impact aerobics, swimming, etc. b. Save the most intensive and strenuous activity for last such as  sit-ups, heavy lifting, contact sports, etc  Refrain from any heavy lifting or straining until you are off narcotics for pain control.   c. DO NOT PUSH THROUGH PAIN.  Let pain be your guide: If it hurts to do something, don't do it.  Pain is your body warning you to avoid that activity for another week until the pain goes down. d. You may drive when you are no longer taking prescription pain medication, you can  comfortably wear a seatbelt, and you can safely maneuver your car and apply brakes. e. Bonita Quin may have sexual intercourse when it is comfortable.  8. FOLLOW UP in our office a. Please call [facility] at [phone] to set up an appointment to see your surgeon in the office for a follow-up appointment approximately 2-3 weeks after your surgery. b. Make sure that you call for this appointment the day you arrive home to insure a convenient appointment time. 9.  IF YOU HAVE DISABILITY OR FAMILY LEAVE FORMS, BRING THEM TO THE OFFICE FOR PROCESSING.  DO NOT GIVE THEM TO YOUR DOCTOR.  WHEN TO CALL us [phone]: 1. Poor pain control 2. Reactions / problems with new medications (rash/itching, nausea, etc)  3. Fever over 101.5 F (38.5 C) 4. Inability to urinate 5. Nausea and/or vomiting 6. Worsening swelling or bruising 7. Continued bleeding from incision. 8. Increased pain, redness, or drainage from the incision   The clinic staff is available to answer your questions during regular business hours (8:30am-5pm).  Please don't hesitate to call and ask to speak to one of our nurses for clinical concerns.   If you have a medical emergency, go to the nearest emergency room or call 911.  A surgeon from [facility] is always on call at the hospitals in [facility], [place] [address], [place], [place]  [address] ?  [address] [address], [place], [place]   [address] MAIN: [phone] ? TOLL FREE: [phone] ? FAX: [phone] [email or web address]  GETTING TO GOOD BOWEL HEALTH. Irregular bowel habits such as constipation and diarrhea can lead to many problems over time.  Having one soft bowel movement a day is the most important way to prevent further problems.  The anorectal canal is designed to handle stretching and feces to safely manage our ability to get rid of solid waste (feces, poop, stool) out of our body.  BUT, hard constipated stools can  act like ripping concrete bricks and diarrhea can be a burning fire to this very sensitive area of our body, causing inflamed hemorrhoids, anal fissures, increasing risk is perirectal abscesses, abdominal pain/bloating, an making irritable bowel worse.     The goal: ONE SOFT BOWEL MOVEMENT A DAY!  To have soft, regular bowel movements:    Drink at least 8 tall glasses of water a day.     Take plenty of fiber.  Fiber is the undigested part of plant food that passes into the colon, acting s "natures broom" to encourage bowel motility and movement.  Fiber can absorb and hold large amounts of water. This results in a larger, bulkier stool, which is soft and easier to pass. Work gradually over several weeks up to 6 servings a day of fiber (25g a day even more if needed) in the form of: o Vegetables -- Root (potatoes, carrots, turnips), leafy green (lettuce, salad greens, celery, spinach), or cooked high residue (cabbage, broccoli, etc) o Fruit -- Fresh (unpeeled skin & pulp), Dried (prunes, apricots,  cherries, etc ),  or stewed ( applesauce)  o Whole grain breads, pasta, etc (whole wheat)  o Bran cereals    Bulking Agent every day the rest of your life -- This type of water-retaining fiber generally is easily obtained each day by one of the following:  o Psyllium bran -- The psyllium plant is remarkable because its ground seeds can retain so much water. This product is available as Metamucil, Konsyl, Effersyllium, Per Diem Fiber, or the less expensive generic preparation in drug and health food stores. Although labeled a laxative, it really is not a laxative.  o Methylcellulose -- This is another fiber derived from wood which also retains water. It is available as Citrucel. o Polyethylene Glycol - and "artificial" fiber commonly called Miralax or Glycolax.  It is helpful for people with gassy or bloated feelings with regular fiber o Flax Seed - a less gassy fiber than psyllium   No reading or other relaxing  activity while on the toilet. If bowel movements take longer than 5 minutes, you are too constipated   AVOID CONSTIPATION.  High fiber and water intake usually takes care of this.  Sometimes a laxative is needed to stimulate more frequent bowel movements, but    Laxatives are not a good long-term solution as it can wear the colon out. o Osmotics (Milk of Magnesia, Fleets phosphosoda, Magnesium citrate, MiraLax, GoLytely) are safer than  o Stimulants (Senokot, Castor Oil, Dulcolax, Ex Lax)    o Do not take laxatives for more than 7days in a row.    IF SEVERELY CONSTIPATED, try a Bowel Retraining Program: o Do not use laxatives.  o Eat a diet high in roughage, such as bran cereals and leafy vegetables.  o Drink six (6) ounces of prune or apricot juice each morning.  o Eat two (2) large servings of stewed fruit each day.  o Take one (1) heaping tablespoon of a psyllium-based bulking agent twice a day. Use sugar-free sweetener when possible to avoid excessive calories.  o Eat a normal breakfast.  o Set aside 15 minutes after breakfast to sit on the toilet, but do not strain to have a bowel movement.  o If you do not have a bowel movement by the third day, use an enema and repeat the above steps.    Controlling diarrhea o Switch to liquids and simpler foods for a few days to avoid stressing your intestines further. o Avoid dairy products (especially milk & ice cream) for a short time.  The intestines often can lose the ability to digest lactose when stressed. o Avoid foods that cause gassiness or bloating.  Typical foods include beans and other legumes, cabbage, broccoli, and dairy foods.  Every person has some sensitivity to other foods, so listen to our body and avoid those foods that trigger problems for you. o Adding fiber (Citrucel, Metamucil, psyllium, Miralax) gradually can help thicken stools by absorbing excess fluid and retrain the intestines to act more normally.  Slowly increase the dose  over a few weeks.  Too much fiber too soon can backfire and cause cramping & bloating. o Probiotics (such as active yogurt, Align, etc) may help repopulate the intestines and colon with normal bacteria and calm down a sensitive digestive tract.  Most studies show it to be of mild help, though, and such products can be costly. o Medicines:   Bismuth subsalicylate (ex. Kayopectate, Pepto Bismol) every 30 minutes for up to 6 doses can help control diarrhea.  Avoid if pregnant.   Loperamide (Immodium) can slow down diarrhea.  Start with two tablets (4mg  total) first and then try one tablet every 6 hours.  Avoid if you are having fevers or severe pain.  If you are not better or start feeling worse, stop all medicines and call your doctor for advice o Call your doctor if you are getting worse or not better.  Sometimes further testing (cultures, endoscopy, X-ray studies, bloodwork, etc) may be needed to help diagnose and treat the cause of the diarrhea. o   Go see [facility] (Dr. Bosie ClosSchooler) to followup on your esophagitis that caused bleeding.  Consider seeing a urologist to help with your problems with urination at night and the need for a Foley catheter with the surgery.  [facility] 336 [phone]

## 2013-09-09 NOTE — Progress Notes (Signed)
Subjective:     Patient ID: Nicholas Herring, male   DOB: Aug 15, 1926, 80 y.o.   MRN: [id number]  HPI  Note: This dictation was prepared with Dragon/digital dictation along with [facility] technology. Any transcriptional errors that result from this process are unintentional.       Nicholas Herring  Jan 21, 1927 [id number]  Patient Care Team: Jonita Albee, MD as PCP - General (Internal Medicine) Griffith Citron, MD as Consulting Physician (Gastroenterology) Lunette Stands, MD as Consulting Physician (Orthopedic Surgery)  Procedure (Date: 08/25/2013):  POST-OPERATIVE DIAGNOSIS: Recurrent bilateral inguinal inguinal hernias   PROCEDURE: Procedure(s):  LAPAROSCOPIC Bilateral INGUINAL HERNIA Repairs (TAPP approach)  INSERTION OF MESH  LAPAROSCOPIC LYSIS OF ADHESIONS   SURGEON:  Ardeth Sportsman, MD   This patient returns for surgical re-evaluation.  He ended up having bilateral hernias.  He developed an ileus a few days later possible partial small bowel obstruction with nausea and vomiting.  Eventually improved with readmission.  He claims he is urinating fine.  His wife is concerned.  He urinates 3 times at night.  That is his baseline.  His wife wants him to see urologist like I recommended.  He does not want to.  They think they are supposed to see gastroenterology but do not know when  His wife feels like he is more distended.  At the same time, his pants are loose and do not fit.  He feels like he is eating better.  Finally making improvement this current week.  Energy level not normal yet.  Has not been walking much.  No fevers or chills.  Having daily bowel movements.  Not taking bowel regimen like we discussed.  He and his wife had many questions about medications, surgery, recovery.  He claims to be eating well.  Appetite back.  Having flatus.  Some bowel movements.  No nausea or vomiting.  Patient Active Problem List   Diagnosis Date Noted  . Acute esophagitis 08/27/2013  .  Melena 08/26/2013  . Hematemesis 08/25/2013  . Ileus, postoperative 08/25/2013  . Hiatal hernia 08/17/2013  . Osteoarthritis of both knees 08/17/2013  . Recurrent bilateral inguinal hernia (BIH) s/p lap repair w mesh 08/23/2013 08/17/2013  . Difficulty in urination     Past Medical History  Diagnosis Date  . Incarcerated incisional hernia 2009  . Difficulty in urination   . Macular degeneration     Right eye  . Colon polyps   . Seborrheic keratoses, inflamed 2013  . Diverticulosis of sigmoid colon   . Osteoarthritis of both knees 08/17/2013  . Acute urinary retention 2009    Resolved after foley & flomax  . Enlarged prostate   . Sleep apnea     pt has CPAP machine but does not wear at bedtime    Past Surgical History  Procedure Laterality Date  . Cholecystectomy  1987  . Laparoscopic assisted ventral hernia repair  2009    Dr Ezzard Standing  . Stomach surgery  1990    reflux surgery?  . Inguinal hernia repair Right infant  . Cataract extraction Left   . Inguinal hernia repair Bilateral 08/23/2013    Procedure: LAPAROSCOPIC Bilateral INGUINAL HERNIA Repairs and TAPP block    ;  Surgeon: Ardeth Sportsman, MD;  Location: [facility] OR;  Service: General;  Laterality: Bilateral;  . Insertion of mesh Bilateral 08/23/2013    Procedure: INSERTION OF MESH ;  Surgeon: Ardeth Sportsman, MD;  Location: [facility] OR;  Service: General;  Laterality: Bilateral;  .  Laparoscopic lysis of adhesions N/A 08/23/2013    Procedure: LAPAROSCOPIC LYSIS OF ADHESIONS;  Surgeon: Ardeth Sportsman, MD;  Location: [facility] OR;  Service: General;  Laterality: N/A;  . Esophagogastroduodenoscopy N/A 08/26/2013    Procedure: ESOPHAGOGASTRODUODENOSCOPY (EGD);  Surgeon: Shirley Friar, MD;  Location: [facility];  Service: Endoscopy;  Laterality: N/A;    History   Social History  . Marital Status: Married    Spouse Name: N/A    Number of Children: N/A  . Years of Education: N/A   Occupational History  . Retired   . Lawyer/Judge     Social History Main Topics  . Smoking status: Former Smoker    Quit date: 08/12/1971  . Smokeless tobacco: Never Used  . Alcohol Use: 3.6 oz/week    6 Cans of beer per week     Comment: weekly  . Drug Use: No  . Sexual Activity: No   Other Topics Concern  . Not on file   Social History Narrative   Lives with his wife.  [employer], class of '52.    History reviewed. No pertinent family history.  Current Outpatient Prescriptions  Medication Sig Dispense Refill  . calcium carbonate (TUMS EX) 750 MG chewable tablet Chew 1 tablet by mouth daily.      . diphenhydrAMINE (BENADRYL) 25 MG tablet Take 25 mg by mouth every 6 (six) hours as needed.      Marland Kitchen oxyCODONE (OXY IR/ROXICODONE) 5 MG immediate release tablet Take 1-2 tablets (5-10 mg total) by mouth every 6 (six) hours as needed for moderate pain, severe pain or breakthrough pain.  40 tablet  0  . pantoprazole (PROTONIX) 40 MG tablet Take 1 tablet (40 mg total) by mouth 2 (two) times daily before a meal.  60 tablet  2  . tamsulosin (FLOMAX) 0.4 MG CAPS capsule Take 1 capsule (0.4 mg total) by mouth daily.  30 capsule  1   No current facility-administered medications for this visit.     Allergies  Allergen Reactions  . Nsaids Other (See Comments)    Ulceration   . Morphine And Related Nausea And Vomiting    BP 118/72  Pulse 70  Resp 18  Ht 6' (1.829 m)  Wt 175 lb (79.379 kg)  BMI 23.73 kg/m2  Dg Abd Acute W/chest  08/29/2013   CLINICAL DATA:  small bowel obstruction  EXAM: ACUTE ABDOMEN SERIES (ABDOMEN 2 VIEW & CHEST 1 VIEW)  COMPARISON:  08/27/2013  FINDINGS: Improving small bowel dilatation with small bowel air-fluid levels compared with the prior study. There is gas in nondilated colon. No free air. Prior ventral hernia repair with mesh. Prior cholecystectomy.  Mild bibasilar atelectasis, slightly increased from the prior study. Negative for heart failure or effusion.  IMPRESSION: Slight increase in  bibasilar atelectasis.  Improving small bowel obstruction.   Electronically Signed   By: Marlan Palau M.D.   On: 08/29/2013 09:58   Dg Abd Acute W/chest  08/27/2013   CLINICAL DATA:  Abdominal pain, suspect small bowel obstruction  EXAM: ACUTE ABDOMEN SERIES (ABDOMEN 2 VIEW & CHEST 1 VIEW)  COMPARISON:  Acute abdominal series of August 25, 2013  FINDINGS: The lung volumes remain low. There remain coarse interstitial markings in the left mid and lower rib hemi thorax. There is no pleural effusion or pulmonary vascular congestion. There is marked tortuosity of the ascending and descending thoracic aorta.  Within the abdomen there remain numerous loops of air and fluid-filled small bowel with the  maximal diameter of a distended small bowel loop to the left of the lumbar spine being 6.8 cm. There is a normal stool and gas pattern within the colon and rectum. No free extraluminal gas collections are demonstrated. There is diffuse osteopenia of the bony structures. There are surgical clips in the gallbladder fossa. The patient has undergone previous abdominal wall repair with multiple coronal clips present.  IMPRESSION: 1. The findings are consistent with a persistent distal small-bowel obstruction versus severe ileus. A small amount of extraluminal gas persists. 2. The lungs remain hypoinflated bilaterally. There is likely subsegmental atelectasis at the left lung base. 3. Nasogastric suction may be useful.   Electronically Signed   By: David  SwazilandJordan   On: 08/27/2013 15:42   Dg Abd Acute W/chest  08/25/2013   CLINICAL DATA:  Nausea and vomiting. Inguinal hernia repair 2 days ago.  EXAM: ACUTE ABDOMEN SERIES (ABDOMEN 2 VIEW & CHEST 1 VIEW)  COMPARISON:  Two-view chest 11/11/2007  FINDINGS: The heart size is normal. Lung volumes are low. The right hemidiaphragm is elevated.  Supine and upright views of the abdomen demonstrate dilated loops of small bowel measuring up to 5 cm. The decubitus images demonstrate  multiple fluid levels. Gas and stool are present throughout the colon. Degenerative changes of the lumbar spine are evident with mild leftward curvature. A small amount free air is present, within normal limits following laparoscopic surgery.  IMPRESSION: 1. Multiple dilated loops of small bowel with fluid levels suggesting a diffuse ileus. 2. A small amount of free air is likely within normal limits following laparoscopic surgery 2 days ago. 3. Low lung volumes with elevation of the right hemidiaphragm. This is nonspecific, but may be related to the recent surgery as well.   Electronically Signed   By: Gennette Pachris  Mattern M.D.   On: 08/25/2013 11:29     Review of Systems  Constitutional: Negative for fever, chills and diaphoresis.  HENT: Negative for sore throat and trouble swallowing.   Eyes: Negative for photophobia and visual disturbance.  Respiratory: Negative for choking and shortness of breath.   Cardiovascular: Negative for chest pain and palpitations.  Gastrointestinal: Negative for nausea, vomiting, abdominal distention, anal bleeding and rectal pain.  Genitourinary: Negative for dysuria, urgency, difficulty urinating and testicular pain.  Musculoskeletal: Negative for arthralgias, gait problem, myalgias and neck pain.  Skin: Negative for color change and rash.  Neurological: Negative for dizziness, speech difficulty, weakness and numbness.  Hematological: Negative for adenopathy.  Psychiatric/Behavioral: Negative for hallucinations, confusion and agitation.       Objective:   Physical Exam  Constitutional: He is oriented to person, place, and time. He appears well-developed and well-nourished. No distress.  HENT:  Head: Normocephalic.  Mouth/Throat: Oropharynx is clear and moist. No oropharyngeal exudate.  Eyes: Conjunctivae and EOM are normal. Pupils are equal, round, and reactive to light. No scleral icterus.  Neck: Normal range of motion. No tracheal deviation present.   Cardiovascular: Normal rate, normal heart sounds and intact distal pulses.   Pulmonary/Chest: Effort normal. No respiratory distress.  Abdominal: Soft. He exhibits distension. There is no tenderness. Hernia confirmed negative in the right inguinal area and confirmed negative in the left inguinal area.  Incisions clean with normal healing ridges.  No hernias  Some more bulging at the flanks.  Very thin abdominal wall.  Musculoskeletal: Normal range of motion. He exhibits no tenderness.  Neurological: He is alert and oriented to person, place, and time. No cranial nerve deficit. He exhibits  normal muscle tone. Coordination normal.  Skin: Skin is warm and dry. No rash noted. He is not diaphoretic.  Psychiatric: He has a normal mood and affect. His behavior is normal.       Assessment:     Complicated recovery in elderly patient status post bilateral inguinal hernia repairs.  Urinary retention/probable BPH.  Issues with noncompliance and fair memory on prior recommendations     Plan:     Increase activity as tolerated to regular activity.  Low impact exercise such as walking an hour a day at least ideal.  Do not push through pain.  Consider water aerobics or swimming pool to more safely mobilized the  Diet as tolerated.  Low fat high fiber diet ideal.  Bowel regimen with 30 g fiber a day and fiber supplement as needed to avoid problems.  I strongly recommend he increase his Metamucil and take it every day the rest of his life.  Double to BID.   I repeated this many times.  Hopefully that will help with his moderate distention.  Return to clinic 3 weeks, sooner as needed.   Instructions discussed.  Followup with primary care physician for other health issues as would normally be done.  Consider screening for malignancies (breast, prostate, colon, melanoma, etc) as appropriate.  I again I recommended he see a urologist help fine-tune things.  May need to be on Flomax indefinitely.  He did not  want to.  His wife did.  All of them argue it out.  [facility] contact info given.  Consider following up with [facility] gastroenterology concerning the esophagitis.  Question of need to continue Protonix indefinitely or just a short course.  They enjoyed Dr. Luetta Nutting and would be glad to see him again.  Questions answered.  The patient expressed understanding and appreciation

## 2013-10-05 ENCOUNTER — Encounter (INDEPENDENT_AMBULATORY_CARE_PROVIDER_SITE_OTHER): Payer: Medicare Other | Admitting: Surgery

## 2013-10-06 ENCOUNTER — Ambulatory Visit (INDEPENDENT_AMBULATORY_CARE_PROVIDER_SITE_OTHER): Payer: Self-pay | Admitting: Surgery

## 2013-12-23 ENCOUNTER — Other Ambulatory Visit (HOSPITAL_COMMUNITY): Payer: Self-pay | Admitting: Surgery

## 2015-07-25 ENCOUNTER — Emergency Department (HOSPITAL_COMMUNITY): Payer: Medicare Other

## 2015-07-25 ENCOUNTER — Emergency Department (HOSPITAL_COMMUNITY)
Admission: EM | Admit: 2015-07-25 | Discharge: 2015-07-25 | Disposition: A | Discharge status: Home / Self Care | Payer: Medicare Other | Attending: Emergency Medicine | Admitting: Emergency Medicine

## 2015-07-25 ENCOUNTER — Encounter (HOSPITAL_COMMUNITY): Payer: Self-pay | Admitting: Emergency Medicine

## 2015-07-25 DIAGNOSIS — Z8719 Personal history of other diseases of the digestive system: Secondary | ICD-10-CM | POA: Insufficient documentation

## 2015-07-25 DIAGNOSIS — Z87891 Personal history of nicotine dependence: Secondary | ICD-10-CM | POA: Diagnosis not present

## 2015-07-25 DIAGNOSIS — Z8669 Personal history of other diseases of the nervous system and sense organs: Secondary | ICD-10-CM | POA: Insufficient documentation

## 2015-07-25 DIAGNOSIS — M170 Bilateral primary osteoarthritis of knee: Secondary | ICD-10-CM | POA: Diagnosis not present

## 2015-07-25 DIAGNOSIS — Z86010 Personal history of colonic polyps: Secondary | ICD-10-CM | POA: Insufficient documentation

## 2015-07-25 DIAGNOSIS — M25562 Pain in left knee: Secondary | ICD-10-CM | POA: Diagnosis present

## 2015-07-25 DIAGNOSIS — Z872 Personal history of diseases of the skin and subcutaneous tissue: Secondary | ICD-10-CM | POA: Insufficient documentation

## 2015-07-25 DIAGNOSIS — Z79899 Other long term (current) drug therapy: Secondary | ICD-10-CM | POA: Insufficient documentation

## 2015-07-25 DIAGNOSIS — N400 Enlarged prostate without lower urinary tract symptoms: Secondary | ICD-10-CM | POA: Diagnosis not present

## 2015-07-25 DIAGNOSIS — M1990 Unspecified osteoarthritis, unspecified site: Secondary | ICD-10-CM

## 2015-07-25 MED ORDER — DICLOFENAC SODIUM 1 % TD GEL: 4.0000 g | Freq: Four times a day (QID) | TRANSDERMAL | Status: DC

## 2015-07-25 MED ORDER — LIDOCAINE 5 % EX PTCH
2.0000 | MEDICATED_PATCH | CUTANEOUS | Status: DC
  Administered 2015-07-25: 2 via TRANSDERMAL
  Filled 2015-07-25 (×3): qty 2

## 2015-07-25 MED ORDER — METHOCARBAMOL 500 MG PO TABS
500.0000 mg | ORAL_TABLET | Freq: Once | ORAL | Status: AC
  Administered 2015-07-25: 500 mg via ORAL
  Filled 2015-07-25: qty 1

## 2015-07-25 NOTE — ED Notes (Signed)
Pt called out for increase in leg cramping. Orders placed.

## 2015-07-25 NOTE — Discharge Instructions (Signed)

## 2015-07-25 NOTE — ED Notes (Signed)
Pt to xray

## 2015-07-25 NOTE — ED Notes (Signed)
Pt transported from home with c/o bilat knee pain, chronic, non traumatic

## 2015-07-25 NOTE — ED Notes (Signed)
PTAR in route  

## 2015-07-25 NOTE — ED Provider Notes (Signed)
CSN: [id number]     Arrival date & time 07/25/15  0243 History   By signing my name below, I, Nicholas Herring, attest that this documentation has been prepared under the direction and in the presence of Nicholas Walsworth, MD.  Electronically Signed: Arlan OrganAshley Herring, ED Scribe. 07/25/2015. 3:26 AM.   Chief Complaint  Patient presents with  . Knee Pain   Patient is a 81 y.o. male presenting with knee pain. The history is provided by the patient. No language interpreter was used.  Knee Pain Location:  Knee Injury: no   Knee location:  L knee and R knee Pain details:    Quality:  Unable to specify   Radiates to:  Does not radiate   Severity:  Moderate   Timing:  Constant   Progression:  Unchanged Chronicity:  Chronic Dislocation: no   Foreign body present:  No foreign bodies Tetanus status:  Unknown Prior injury to area:  Unable to specify Relieved by:  Nothing Worsened by:  Bearing weight, exercise and activity Ineffective treatments:  None tried Associated symptoms: decreased ROM   Associated symptoms: no back pain, no fever, no muscle weakness and no numbness   Risk factors: no concern for non-accidental trauma     HPI Comments: Nicholas Herring is a 81 y.o. male with a PMHx of osteoarthritis who presents to the Emergency Department complaining of constant, ongoing bilateral knee pain that is chronic in nature but worsened this evening. No recent injury or trauma to knees. Pt states he is unable to ambulate at this time due to pain. Prescribed Oxycodone and Tramadol attempted prior to arrival with mild temporary improvement. No recent fever, chills, nausea, or vomiting. No weakness, loss of sensation, or numbness. Pt was evaluated by his PCP approximately 3 weeks ago. At that time, X-Ray performed and pt was started on pain medication.  PCP: Nicholas DueGUEST, [name], MD    Past Medical History  Diagnosis Date  . Incarcerated incisional hernia 2009  . Difficulty in urination   . Macular  degeneration     Right eye  . Colon polyps   . Seborrheic keratoses, inflamed 2013  . Diverticulosis of sigmoid colon   . Osteoarthritis of both knees 08/17/2013  . Acute urinary retention 2009    Resolved after foley & flomax  . Enlarged prostate   . Sleep apnea     pt has CPAP machine but does not wear at bedtime   Past Surgical History  Procedure Laterality Date  . Cholecystectomy  1987  . Laparoscopic assisted ventral hernia repair  2009    Dr Ezzard StandingNewman  . Stomach surgery  1990    reflux surgery?  . Inguinal hernia repair Right infant  . Cataract extraction Left   . Inguinal hernia repair Bilateral 08/23/2013    Procedure: LAPAROSCOPIC Bilateral INGUINAL HERNIA Repairs and TAPP block    ;  Surgeon: Ardeth SportsmanSteven C. Gross, MD;  Location: [facility] OR;  Service: General;  Laterality: Bilateral;  . Insertion of mesh Bilateral 08/23/2013    Procedure: INSERTION OF MESH ;  Surgeon: Ardeth SportsmanSteven C. Gross, MD;  Location: [facility] OR;  Service: General;  Laterality: Bilateral;  . Laparoscopic lysis of adhesions N/A 08/23/2013    Procedure: LAPAROSCOPIC LYSIS OF ADHESIONS;  Surgeon: Ardeth SportsmanSteven C. Gross, MD;  Location: [facility] OR;  Service: General;  Laterality: N/A;  . Esophagogastroduodenoscopy N/A 08/26/2013    Procedure: ESOPHAGOGASTRODUODENOSCOPY (EGD);  Surgeon: Shirley FriarVincent C. Schooler, MD;  [facility];  Service: Endoscopy;  Laterality: N/A;  No family history on file. Social History  Substance Use Topics  . Smoking status: Former Smoker    Quit date: 08/12/1971  . Smokeless tobacco: Never Used  . Alcohol Use: 3.6 oz/week    6 Cans of beer per week     Comment: weekly    Review of Systems  Constitutional: Negative for fever and chills.  Respiratory: Negative for cough and shortness of breath.   Cardiovascular: Negative for chest pain.  Gastrointestinal: Negative for nausea, vomiting and abdominal pain.  Genitourinary: Negative for dysuria.  Musculoskeletal: Positive for arthralgias. Negative for back  pain.  Neurological: Negative for weakness and numbness.  Psychiatric/Behavioral: Negative for confusion.  All other systems reviewed and are negative.     Allergies  Nsaids and Morphine and related  Home Medications   Prior to Admission medications   Medication Sig Start Date End Date Taking? Authorizing Provider  calcium carbonate (TUMS EX) 750 MG chewable tablet Chew 1 tablet by mouth daily.    Historical Provider, MD  diphenhydrAMINE (BENADRYL) 25 MG tablet Take 25 mg by mouth every 6 (six) hours as needed.    Historical Provider, MD  oxyCODONE (OXY IR/ROXICODONE) 5 MG immediate release tablet Take 1-2 tablets (5-10 mg total) by mouth every 6 (six) hours as needed for moderate pain, severe pain or breakthrough pain. 08/23/13   Karie Soda, MD  pantoprazole (PROTONIX) 40 MG tablet Take 1 tablet (40 mg total) by mouth 2 (two) times daily before a meal. 08/31/13   Karie Soda, MD  tamsulosin (FLOMAX) 0.4 MG CAPS capsule Take 1 capsule (0.4 mg total) by mouth daily. 08/17/13   Karie Soda, MD   Triage Vitals: BP 125/69 mmHg  Pulse 77  Temp(Src) 97.8 F (36.6 C) (Oral)  SpO2 98%   Physical Exam  Constitutional: He is oriented to person, place, and time. He appears well-developed and well-nourished.  HENT:  Head: Normocephalic and atraumatic.  Mouth/Throat: Oropharynx is clear and moist. No oropharyngeal exudate.  Eyes: Conjunctivae and EOM are normal. Pupils are equal, round, and reactive to light.  Neck: Normal range of motion. Neck supple.  Cardiovascular: Normal rate, regular rhythm, normal heart sounds and intact distal pulses.   Pulmonary/Chest: Effort normal and breath sounds normal. No respiratory distress. He has no wheezes. He has no rales.  Abdominal: Soft. Bowel sounds are normal. He exhibits no distension. There is no tenderness. There is no rebound and no guarding.  Musculoskeletal: Normal range of motion. He exhibits no edema or tenderness.       Right knee: He  exhibits no swelling and no effusion. No MCL, no LCL and no patellar tendon tenderness noted.       Left knee: He exhibits no swelling and no effusion. No MCL, no LCL and no patellar tendon tenderness noted.  Negative anterior and posterior drawer test Intact DTR and DP pulse bilaterally All patellar tendons intact bilaterally  No tibial plateau  tenderness bilaterally  Neurological: He is alert and oriented to person, place, and time. He has normal reflexes. He displays normal reflexes.  Skin: Skin is warm and dry.  Psychiatric: He has a normal mood and affect. Judgment normal.  Nursing note and vitals reviewed.   ED Course  Procedures (including critical care time)  DIAGNOSTIC STUDIES: Oxygen Saturation is 98% on RA, Normal by my interpretation.    COORDINATION OF CARE: 3:23 AM- Will order X-Rays. Discussed treatment plan with pt at bedside and pt agreed to plan.  Labs Review Labs Reviewed - No data to display  Imaging Review No results found. I have personally reviewed and evaluated these images and lab results as part of my medical decision-making.   EKG Interpretation None      MDM   Final diagnoses:  None    Chronic knee pain.  Per orthopedics they can't do anything more.  On 2 narcotics without relief.  Not wearing his knee brace.  Will start voltaren gel as will provide local relief and have patient follow up with his PMD and orthopedics  I personally performed the services described in this documentation, which was scribed in my presence. The recorded information has been reviewed and is accurate.     Cy Blamer, MD 07/25/15 [phone]

## 2016-03-07 ENCOUNTER — Other Ambulatory Visit: Payer: Self-pay | Admitting: Physician Assistant

## 2016-05-20 ENCOUNTER — Encounter: Payer: Self-pay | Admitting: *Deleted

## 2016-05-22 ENCOUNTER — Ambulatory Visit: Payer: Medicare Other | Admitting: Certified Registered Nurse Anesthetist

## 2016-05-22 ENCOUNTER — Ambulatory Visit
Admission: RE | Admit: 2016-05-22 | Discharge: 2016-05-22 | Disposition: A | Discharge status: Home / Self Care | Payer: Medicare Other | Source: Ambulatory Visit | Attending: Ophthalmology | Admitting: Ophthalmology

## 2016-05-22 ENCOUNTER — Encounter
Admission: RE | Disposition: A | Discharge status: Home / Self Care | Payer: Self-pay | Source: Ambulatory Visit | Attending: Ophthalmology

## 2016-05-22 DIAGNOSIS — Z87891 Personal history of nicotine dependence: Secondary | ICD-10-CM | POA: Diagnosis not present

## 2016-05-22 DIAGNOSIS — H2511 Age-related nuclear cataract, right eye: Secondary | ICD-10-CM | POA: Insufficient documentation

## 2016-05-22 DIAGNOSIS — G4730 Sleep apnea, unspecified: Secondary | ICD-10-CM | POA: Diagnosis not present

## 2016-05-22 HISTORY — PX: CATARACT EXTRACTION W/PHACO: SHX586

## 2016-05-22 SURGERY — PHACOEMULSIFICATION, CATARACT, WITH IOL INSERTION
Anesthesia: Monitor Anesthesia Care | Site: Eye | Laterality: Right | Wound class: Clean

## 2016-05-22 MED ORDER — PHENYLEPHRINE HCL 10 % OP SOLN
OPHTHALMIC | Status: AC
  Filled 2016-05-22: qty 5

## 2016-05-22 MED ORDER — SODIUM HYALURONATE 10 MG/ML IO SOLN
INTRAOCULAR | Status: DC | PRN
  Administered 2016-05-22: 0.85 mL via INTRAOCULAR

## 2016-05-22 MED ORDER — MOXIFLOXACIN HCL 0.5 % OP SOLN
OPHTHALMIC | Status: AC
  Filled 2016-05-22: qty 3

## 2016-05-22 MED ORDER — MOXIFLOXACIN HCL 0.5 % OP SOLN: 1.0000 [drp] | OPHTHALMIC | Status: DC | PRN

## 2016-05-22 MED ORDER — LIDOCAINE HCL (PF) 4 % IJ SOLN
INTRAMUSCULAR | Status: AC
  Filled 2016-05-22: qty 5

## 2016-05-22 MED ORDER — EPINEPHRINE PF 1 MG/ML IJ SOLN
INTRAOCULAR | Status: DC | PRN
  Administered 2016-05-22: 250 mL via OPHTHALMIC

## 2016-05-22 MED ORDER — POVIDONE-IODINE 5 % OP SOLN
OPHTHALMIC | Status: AC
  Filled 2016-05-22: qty 30

## 2016-05-22 MED ORDER — SODIUM HYALURONATE 23 MG/ML IO SOLN
INTRAOCULAR | Status: DC | PRN
  Administered 2016-05-22: 0.6 mL via INTRAOCULAR

## 2016-05-22 MED ORDER — SODIUM HYALURONATE 10 MG/ML IO SOLN
INTRAOCULAR | Status: AC
  Filled 2016-05-22: qty 0.85

## 2016-05-22 MED ORDER — MOXIFLOXACIN HCL 0.5 % OP SOLN
OPHTHALMIC | Status: DC | PRN
  Administered 2016-05-22: 5 [drp] via OPHTHALMIC

## 2016-05-22 MED ORDER — SODIUM HYALURONATE 23 MG/ML IO SOLN
INTRAOCULAR | Status: AC
  Filled 2016-05-22: qty 0.6

## 2016-05-22 MED ORDER — SODIUM CHLORIDE 0.9 % IV SOLN
INTRAVENOUS | Status: DC
Start: 2016-05-22 — End: 2016-05-22
  Administered 2016-05-22: 10:00:00 via INTRAVENOUS

## 2016-05-22 MED ORDER — ARMC OPHTHALMIC DILATING DROPS
1.0000 "application " | OPHTHALMIC | Status: AC
  Administered 2016-05-22 (×3): 1 via OPHTHALMIC

## 2016-05-22 MED ORDER — LIDOCAINE HCL (PF) 4 % IJ SOLN
INTRAOCULAR | Status: DC | PRN
  Administered 2016-05-22: 4 mL via OPHTHALMIC

## 2016-05-22 MED ORDER — EPINEPHRINE PF 1 MG/ML IJ SOLN
INTRAMUSCULAR | Status: AC
  Filled 2016-05-22: qty 2

## 2016-05-22 MED ORDER — CYCLOPENTOLATE HCL 2 % OP SOLN
OPHTHALMIC | Status: AC
  Filled 2016-05-22: qty 2

## 2016-05-22 SURGICAL SUPPLY — 23 items
CANNULA ANT/CHMB 27G (MISCELLANEOUS) ×2 IMPLANT
CANNULA ANT/CHMB 27GA (MISCELLANEOUS) ×6 IMPLANT
CUP MEDICINE 2OZ PLAST GRAD ST (MISCELLANEOUS) ×3 IMPLANT
GLOVE BIO SURGEON STRL SZ8 (GLOVE) ×3 IMPLANT
GLOVE BIOGEL M 6.5 STRL (GLOVE) ×3 IMPLANT
GLOVE SURG LX 7.5 STRW (GLOVE) ×2
GLOVE SURG LX STRL 7.5 STRW (GLOVE) ×1 IMPLANT
GOWN STRL REUS W/ TWL LRG LVL3 (GOWN DISPOSABLE) ×2 IMPLANT
GOWN STRL REUS W/TWL LRG LVL3 (GOWN DISPOSABLE) ×6
LENS IOL ACRSF IQ PC 18.5 (Intraocular Lens) IMPLANT
LENS IOL ACRYSOF IQ POST 18.5 (Intraocular Lens) ×3 IMPLANT
PACK CATARACT (MISCELLANEOUS) ×3 IMPLANT
PACK CATARACT BRASINGTON LX (MISCELLANEOUS) ×3 IMPLANT
PACK EYE AFTER SURG (MISCELLANEOUS) ×3 IMPLANT
SOL BSS BAG (MISCELLANEOUS) ×3
SOL PREP PVP 2OZ (MISCELLANEOUS) ×3
SOLUTION BSS BAG (MISCELLANEOUS) ×1 IMPLANT
SOLUTION PREP PVP 2OZ (MISCELLANEOUS) ×1 IMPLANT
SYR 3ML LL SCALE MARK (SYRINGE) ×6 IMPLANT
SYR 5ML LL (SYRINGE) ×3 IMPLANT
SYR TB 1ML 27GX1/2 LL (SYRINGE) ×3 IMPLANT
WATER STERILE IRR 250ML POUR (IV SOLUTION) ×3 IMPLANT
WIPE NON LINTING 3.25X3.25 (MISCELLANEOUS) ×3 IMPLANT

## 2016-05-22 NOTE — Anesthesia Postprocedure Evaluation (Signed)
Anesthesia Post Note  Patient: Nicholas Herring  Procedure(s) Performed: Procedure(s) (LRB): CATARACT EXTRACTION PHACO AND INTRAOCULAR LENS PLACEMENT (IOC) (Right)  Patient location during evaluation: PACU Anesthesia Type: MAC Level of consciousness: awake and alert and oriented Pain management: satisfactory to patient Vital Signs Assessment: post-procedure vital signs reviewed and stable Respiratory status: respiratory function stable Cardiovascular status: stable Anesthetic complications: no    Last Vitals:  Vitals:   05/22/16 0934  BP: (!) 146/71  Pulse: 70  Resp: 18  Temp: 36.6 C    Last Pain:  Vitals:   05/22/16 0934  TempSrc: Oral                 Clydene PughBeane, Ceciley Buist D

## 2016-05-22 NOTE — H&P (Signed)
The History and Physical notes are on paper, have been signed, and are to be scanned. The patient remains stable and unchanged from the H&P.   Previous H&P reviewed, patient examined, and there are no changes.  Nicholas Herring 05/22/2016 10:50 AM

## 2016-05-22 NOTE — Discharge Instructions (Signed)
1.  Children may look as if they have a slight fever; their face might be red and their skin      may feel warm.  The medication given pre-operatively usually causes this to happen.   2.  The medications used today in surgery may make your child feel sleepy for the                 remainder of the day.  Many children, however, may be ready to resume normal             activities within several hours.   3.  Please encourage your child to drink extra fluids today.  You may gradually resume         your child's normal diet as tolerated.   4.  Please notify your doctor immediately if your child has any unusual bleeding, trouble      breathing, fever or pain not relieved by medication.   5.  Specific Instructions:  Eye Surgery Discharge Instructions  Expect mild scratchy sensation or mild soreness. DO NOT RUB YOUR EYE!  The day of surgery:  Minimal physical activity, but bed rest is not required  No reading, computer work, or close hand work  No bending, lifting, or straining.  May watch TV  For 24 hours:  No driving, legal decisions, or alcoholic beverages  Safety precautions  Eat anything you prefer: It is better to start with liquids, then soup then solid foods.  _____ Eye patch should be worn until postoperative exam tomorrow.  ____ Solar shield eyeglasses should be worn for comfort in the sunlight/patch while sleeping  Resume all regular medications including aspirin or Coumadin if these were discontinued prior to surgery. You may shower, bathe, shave, or wash your hair. Tylenol may be taken for mild discomfort.  Call your doctor if you experience significant pain, nausea, or vomiting, fever > 101 or other signs of infection. [phone] or [phone] Specific instructions:  Follow-up Information    Willey Blade, MD .   Specialty:  Ophthalmology Why:  05-23-16 at10;00 Contact information: [address] [place] [address] [phone]        Ranae Pila, [place] .   Specialty:  Pediatric Anesthesia Contact information: [address] [place] [address] [phone]          Eye Surgery Discharge Instructions  Expect mild scratchy sensation or mild soreness. DO NOT RUB YOUR EYE!  The day of surgery:  Minimal physical activity, but bed rest is not required  No reading, computer work, or close hand work  No bending, lifting, or straining.  May watch TV  For 24 hours:  No driving, legal decisions, or alcoholic beverages  Safety precautions  Eat anything you prefer: It is better to start with liquids, then soup then solid foods.  _____ Eye patch should be worn until postoperative exam tomorrow.  ____ Solar shield eyeglasses should be worn for comfort in the sunlight/patch while sleeping  Resume all regular medications including aspirin or Coumadin if these were discontinued prior to surgery. You may shower, bathe, shave, or wash your hair. Tylenol may be taken for mild discomfort.  Call your doctor if you experience significant pain, nausea, or vomiting, fever > 101 or other signs of infection. [phone] or [phone] Specific instructions:  Follow-up Information    Willey Blade, MD .   Specialty:  Ophthalmology Why:  05-23-16 at10;00 Contact information: [address] [place] [address] [phone]  Ranae PilaAshley Covert King, [place]NP .   Specialty:  Pediatric Anesthesia Contact information: [address] [place] [address] [phone]

## 2016-05-22 NOTE — Anesthesia Preprocedure Evaluation (Signed)
Anesthesia Evaluation  Patient identified by MRN, date of birth, ID band Patient awake    Reviewed: Allergy & Precautions, NPO status , Patient's Chart, lab work & pertinent test results  Airway Mallampati: II       Dental  (+) Teeth Intact   Pulmonary sleep apnea , former smoker,    breath sounds clear to auscultation       Cardiovascular Exercise Tolerance: Good  Rhythm:Regular Rate:Normal     Neuro/Psych negative psych ROS   GI/Hepatic Neg liver ROS, hiatal hernia,   Endo/Other  negative endocrine ROS  Renal/GU negative Renal ROS     Musculoskeletal   Abdominal (+) + obese,   Peds  Hematology   Anesthesia Other Findings   Reproductive/Obstetrics                             Anesthesia Physical Anesthesia Plan  ASA: III  Anesthesia Plan: MAC   Post-op Pain Management:    Induction: Intravenous  Airway Management Planned: Natural Airway and Nasal Cannula  Additional Equipment:   Intra-op Plan:   Post-operative Plan:   Informed Consent: I have reviewed the patients History and Physical, chart, labs and discussed the procedure including the risks, benefits and alternatives for the proposed anesthesia with the patient or authorized representative who has indicated his/her understanding and acceptance.     Plan Discussed with: CRNA  Anesthesia Plan Comments:         Anesthesia Quick Evaluation

## 2016-05-22 NOTE — Transfer of Care (Signed)
Immediate Anesthesia Transfer of Care Note  Patient: Nicholas Herring  Procedure(s) Performed: Procedure(s) with comments: CATARACT EXTRACTION PHACO AND INTRAOCULAR LENS PLACEMENT (IOC) (Right) - Lot# [id number] H US: 03:33.8 AP%: 15.8 CDE: 33.90  Patient Location: PACU  Anesthesia Type:MAC  Level of Consciousness: awake, alert  and oriented  Airway & Oxygen Therapy: Patient Spontanous Breathing  Post-op Assessment: Report given to RN and Post -op Vital signs reviewed and stable  Post vital signs: Reviewed and stable  Last Vitals:  Vitals:   05/22/16 0934  BP: (!) 146/71  Pulse: 70  Resp: 18  Temp: 36.6 C    Last Pain:  Vitals:   05/22/16 0934  TempSrc: Oral         Complications: No apparent anesthesia complications

## 2016-05-22 NOTE — Op Note (Signed)
OPERATIVE NOTE  Nicholas Herring [id number] 05/22/2016   PREOPERATIVE DIAGNOSIS:  Nuclear sclerotic cataract right eye.  H25.11   POSTOPERATIVE DIAGNOSIS:    Nuclear sclerotic cataract right eye.     PROCEDURE:  Phacoemusification with posterior chamber intraocular lens placement of the right eye   LENS:   Implant Name Type Inv. Item Serial No. Manufacturer Lot No. LRB No. Used  IMPLANT LENS - [id number] Intraocular Lens IMPLANT LENS [id number] ALCON   Right 1       SN60WF 18.5   ULTRASOUND TIME: 3 minutes 33 seconds.  CDE 33.9   SURGEON:  [name], MD, MPH  ANESTHESIOLOGIST: Anesthesiologist: [name], MD CRNA: [name], CRNA; [name], CRNA   ANESTHESIA:  Topical with tetracaine drops and 2% Xylocaine jelly, augmented with 1% preservative-free intracameral lidocaine.  ESTIMATED BLOOD LOSS: less than 1 mL.   COMPLICATIONS:  None.   DESCRIPTION OF PROCEDURE:  The patient was identified in the holding room and transported to the operating room and placed in the supine position under the operating microscope.  The right eye was identified as the operative eye and it was prepped and draped in the usual sterile ophthalmic fashion.   A 1.0 millimeter clear-corneal paracentesis was made at the 10:30 position. 0.5 ml of preservative-free 1% lidocaine with epinephrine was injected into the anterior chamber.  The anterior chamber was filled with Healon 5 viscoelastic.  A 2.4 millimeter keratome was used to make a near-clear corneal incision at the 8:00 position.  A curvilinear capsulorrhexis was made with a cystotome and capsulorrhexis forceps.  Balanced salt solution was used to hydrodissect and hydrodelineate the nucleus.   Phacoemulsification was then used in stop and chop fashion to remove the lens nucleus and epinucleus.  The remaining cortex was then removed using the irrigation and aspiration handpiece. Healon was then placed into the capsular  bag to distend it for lens placement.  A lens was then injected into the capsular bag.  The remaining viscoelastic was aspirated.  The lens was very dense and required many chopping maneuvers and significant phaco energy to remove.   Wounds were hydrated with balanced salt solution.  The anterior chamber was inflated to a physiologic pressure with balanced salt solution.    Intracameral vigamox 0.1 mL undiluted was injected into the eye.  No wound leaks were noted.  Topical Vigamox drops were applied to the eye.  The patient was taken to the recovery room in stable condition without complications of anesthesia or surgery  [name] 05/22/2016, 12:04 PM

## 2016-08-22 ENCOUNTER — Inpatient Hospital Stay (HOSPITAL_COMMUNITY)
Admission: EM | Admit: 2016-08-22 | Discharge: 2016-08-26 | DRG: 193 | Disposition: A | Discharge status: Home Health | Payer: Medicare Other | Attending: Internal Medicine | Admitting: Internal Medicine

## 2016-08-22 ENCOUNTER — Encounter (HOSPITAL_COMMUNITY): Payer: Self-pay | Admitting: Emergency Medicine

## 2016-08-22 ENCOUNTER — Emergency Department (HOSPITAL_COMMUNITY): Payer: Medicare Other

## 2016-08-22 DIAGNOSIS — G4733 Obstructive sleep apnea (adult) (pediatric): Secondary | ICD-10-CM | POA: Diagnosis present

## 2016-08-22 DIAGNOSIS — F0390 Unspecified dementia without behavioral disturbance: Secondary | ICD-10-CM | POA: Diagnosis present

## 2016-08-22 DIAGNOSIS — Z86010 Personal history of colonic polyps: Secondary | ICD-10-CM | POA: Diagnosis not present

## 2016-08-22 DIAGNOSIS — G4730 Sleep apnea, unspecified: Secondary | ICD-10-CM | POA: Diagnosis not present

## 2016-08-22 DIAGNOSIS — R739 Hyperglycemia, unspecified: Secondary | ICD-10-CM | POA: Diagnosis present

## 2016-08-22 DIAGNOSIS — Y92238 Other place in hospital as the place of occurrence of the external cause: Secondary | ICD-10-CM | POA: Diagnosis present

## 2016-08-22 DIAGNOSIS — W19XXXA Unspecified fall, initial encounter: Secondary | ICD-10-CM | POA: Diagnosis not present

## 2016-08-22 DIAGNOSIS — E785 Hyperlipidemia, unspecified: Secondary | ICD-10-CM | POA: Diagnosis present

## 2016-08-22 DIAGNOSIS — J1100 Influenza due to unidentified influenza virus with unspecified type of pneumonia: Principal | ICD-10-CM | POA: Diagnosis present

## 2016-08-22 DIAGNOSIS — Z961 Presence of intraocular lens: Secondary | ICD-10-CM | POA: Diagnosis present

## 2016-08-22 DIAGNOSIS — L821 Other seborrheic keratosis: Secondary | ICD-10-CM | POA: Diagnosis present

## 2016-08-22 DIAGNOSIS — R69 Illness, unspecified: Secondary | ICD-10-CM

## 2016-08-22 DIAGNOSIS — R112 Nausea with vomiting, unspecified: Secondary | ICD-10-CM | POA: Diagnosis present

## 2016-08-22 DIAGNOSIS — Z9049 Acquired absence of other specified parts of digestive tract: Secondary | ICD-10-CM | POA: Diagnosis not present

## 2016-08-22 DIAGNOSIS — Z87891 Personal history of nicotine dependence: Secondary | ICD-10-CM | POA: Diagnosis not present

## 2016-08-22 DIAGNOSIS — K449 Diaphragmatic hernia without obstruction or gangrene: Secondary | ICD-10-CM | POA: Diagnosis present

## 2016-08-22 DIAGNOSIS — R34 Anuria and oliguria: Secondary | ICD-10-CM | POA: Diagnosis not present

## 2016-08-22 DIAGNOSIS — N400 Enlarged prostate without lower urinary tract symptoms: Secondary | ICD-10-CM | POA: Diagnosis present

## 2016-08-22 DIAGNOSIS — Z886 Allergy status to analgesic agent status: Secondary | ICD-10-CM | POA: Diagnosis not present

## 2016-08-22 DIAGNOSIS — Z9841 Cataract extraction status, right eye: Secondary | ICD-10-CM

## 2016-08-22 DIAGNOSIS — Z885 Allergy status to narcotic agent status: Secondary | ICD-10-CM

## 2016-08-22 DIAGNOSIS — J111 Influenza due to unidentified influenza virus with other respiratory manifestations: Secondary | ICD-10-CM | POA: Diagnosis present

## 2016-08-22 DIAGNOSIS — M170 Bilateral primary osteoarthritis of knee: Secondary | ICD-10-CM | POA: Diagnosis present

## 2016-08-22 DIAGNOSIS — J9601 Acute respiratory failure with hypoxia: Secondary | ICD-10-CM | POA: Diagnosis not present

## 2016-08-22 DIAGNOSIS — M549 Dorsalgia, unspecified: Secondary | ICD-10-CM

## 2016-08-22 DIAGNOSIS — Z79899 Other long term (current) drug therapy: Secondary | ICD-10-CM | POA: Diagnosis not present

## 2016-08-22 DIAGNOSIS — R0602 Shortness of breath: Secondary | ICD-10-CM | POA: Diagnosis present

## 2016-08-22 DIAGNOSIS — H3530 Unspecified macular degeneration: Secondary | ICD-10-CM | POA: Diagnosis present

## 2016-08-22 DIAGNOSIS — Z9842 Cataract extraction status, left eye: Secondary | ICD-10-CM

## 2016-08-22 DIAGNOSIS — M6281 Muscle weakness (generalized): Secondary | ICD-10-CM

## 2016-08-22 DIAGNOSIS — K219 Gastro-esophageal reflux disease without esophagitis: Secondary | ICD-10-CM | POA: Diagnosis present

## 2016-08-22 DIAGNOSIS — J189 Pneumonia, unspecified organism: Secondary | ICD-10-CM | POA: Diagnosis present

## 2016-08-22 LAB — URINALYSIS, ROUTINE W REFLEX MICROSCOPIC
Bilirubin Urine: NEGATIVE
GLUCOSE, UA: NEGATIVE mg/dL
Hgb urine dipstick: NEGATIVE
KETONES UR: NEGATIVE mg/dL
Leukocytes, UA: NEGATIVE
NITRITE: NEGATIVE
PH: 5 (ref 5.0–8.0)
PROTEIN: NEGATIVE mg/dL
Specific Gravity, Urine: 1.023 (ref 1.005–1.030)

## 2016-08-22 LAB — CBC
HEMATOCRIT: 40.5 % (ref 39.0–52.0)
HEMATOCRIT: 39 % (ref 39.0–52.0)
HEMOGLOBIN: 13.6 g/dL (ref 13.0–17.0)
HEMOGLOBIN: 12.6 g/dL — AB (ref 13.0–17.0)
MCH: 29.9 pg (ref 26.0–34.0)
MCH: 28.6 pg (ref 26.0–34.0)
MCHC: 33.6 g/dL (ref 30.0–36.0)
MCHC: 32.3 g/dL (ref 30.0–36.0)
MCV: 89 fL (ref 78.0–100.0)
MCV: 88.4 fL (ref 78.0–100.0)
Platelets: 238 10*3/uL (ref 150–400)
Platelets: 244 10*3/uL (ref 150–400)
RBC: 4.55 MIL/uL (ref 4.22–5.81)
RBC: 4.41 MIL/uL (ref 4.22–5.81)
RDW: 15.2 % (ref 11.5–15.5)
RDW: 15.2 % (ref 11.5–15.5)
WBC: 13 10*3/uL — AB (ref 4.0–10.5)
WBC: 8.7 10*3/uL (ref 4.0–10.5)

## 2016-08-22 LAB — INFLUENZA PANEL BY PCR (TYPE A & B)
INFLBPCR: NEGATIVE
Influenza A By PCR: NEGATIVE

## 2016-08-22 LAB — DIFFERENTIAL
BASOS ABS: 0 10*3/uL (ref 0.0–0.1)
Basophils Relative: 0 %
EOS ABS: 0.3 10*3/uL (ref 0.0–0.7)
EOS PCT: 2 %
LYMPHS ABS: 0.6 10*3/uL — AB (ref 0.7–4.0)
Lymphocytes Relative: 4 %
Monocytes Absolute: 0.6 10*3/uL (ref 0.1–1.0)
Monocytes Relative: 5 %
Neutro Abs: 11.5 10*3/uL — ABNORMAL HIGH (ref 1.7–7.7)
Neutrophils Relative %: 89 %

## 2016-08-22 LAB — COMPREHENSIVE METABOLIC PANEL
ALBUMIN: 3 g/dL — AB (ref 3.5–5.0)
ALT: 16 U/L — ABNORMAL LOW (ref 17–63)
ANION GAP: 7 (ref 5–15)
AST: 18 U/L (ref 15–41)
Alkaline Phosphatase: 112 U/L (ref 38–126)
BUN: 19 mg/dL (ref 6–20)
CHLORIDE: 108 mmol/L (ref 101–111)
CO2: 23 mmol/L (ref 22–32)
Calcium: 8.1 mg/dL — ABNORMAL LOW (ref 8.9–10.3)
Creatinine, Ser: 0.56 mg/dL — ABNORMAL LOW (ref 0.61–1.24)
GFR calc Af Amer: >60 mL/min (ref >60)
Glucose, Bld: 160 mg/dL — ABNORMAL HIGH (ref 65–99)
POTASSIUM: 3.9 mmol/L (ref 3.5–5.1)
Sodium: 138 mmol/L (ref 135–145)
Total Bilirubin: 1.1 mg/dL (ref 0.3–1.2)
Total Protein: 6.2 g/dL — ABNORMAL LOW (ref 6.5–8.1)

## 2016-08-22 LAB — CREATININE, SERUM
Creatinine, Ser: 0.77 mg/dL (ref 0.61–1.24)
GFR calc Af Amer: >60 mL/min (ref >60)
GFR calc non Af Amer: >60 mL/min (ref >60)

## 2016-08-22 LAB — MAGNESIUM: Magnesium: 2.1 mg/dL (ref 1.7–2.4)

## 2016-08-22 LAB — PHOSPHORUS: PHOSPHORUS: 2.6 mg/dL (ref 2.5–4.6)

## 2016-08-22 LAB — LIPASE, BLOOD: LIPASE: 16 U/L (ref 11–51)

## 2016-08-22 MED ORDER — IPRATROPIUM-ALBUTEROL 0.5-2.5 (3) MG/3ML IN SOLN: 3.0000 mL | Freq: Four times a day (QID) | RESPIRATORY_TRACT | Status: DC | PRN

## 2016-08-22 MED ORDER — ONDANSETRON HCL 4 MG/2ML IJ SOLN
4.0000 mg | Freq: Four times a day (QID) | INTRAMUSCULAR | Status: DC | PRN
  Administered 2016-08-24: 4 mg via INTRAVENOUS
  Filled 2016-08-22: qty 2

## 2016-08-22 MED ORDER — DEXTROSE 5 % IV SOLN
1.0000 g | INTRAVENOUS | Status: DC
  Administered 2016-08-23 – 2016-08-26 (×4): 1 g via INTRAVENOUS
  Filled 2016-08-22 (×4): qty 10

## 2016-08-22 MED ORDER — AZITHROMYCIN 500 MG IV SOLR
500.0000 mg | INTRAVENOUS | Status: DC
  Filled 2016-08-22: qty 500

## 2016-08-22 MED ORDER — INFLUENZA VAC SPLIT QUAD 0.5 ML IM SUSY
0.5000 mL | PREFILLED_SYRINGE | INTRAMUSCULAR | Status: DC
  Filled 2016-08-22: qty 0.5

## 2016-08-22 MED ORDER — GUAIFENESIN ER 600 MG PO TB12
600.0000 mg | ORAL_TABLET | Freq: Two times a day (BID) | ORAL | Status: DC
  Administered 2016-08-22 – 2016-08-26 (×9): 600 mg via ORAL
  Filled 2016-08-22 (×9): qty 1

## 2016-08-22 MED ORDER — ACETAMINOPHEN 325 MG PO TABS
650.0000 mg | ORAL_TABLET | Freq: Four times a day (QID) | ORAL | Status: DC | PRN
  Administered 2016-08-22 – 2016-08-23 (×3): 650 mg via ORAL
  Filled 2016-08-22 (×3): qty 2

## 2016-08-22 MED ORDER — PANTOPRAZOLE SODIUM 40 MG PO TBEC
40.0000 mg | DELAYED_RELEASE_TABLET | Freq: Every day | ORAL | Status: DC
  Administered 2016-08-22 – 2016-08-26 (×5): 40 mg via ORAL
  Filled 2016-08-22 (×5): qty 1

## 2016-08-22 MED ORDER — ONDANSETRON HCL 4 MG/2ML IJ SOLN: 4.0000 mg | Freq: Once | INTRAMUSCULAR | Status: DC

## 2016-08-22 MED ORDER — BUDESONIDE 0.25 MG/2ML IN SUSP
0.2500 mg | Freq: Two times a day (BID) | RESPIRATORY_TRACT | Status: DC
  Administered 2016-08-22 – 2016-08-26 (×8): 0.25 mg via RESPIRATORY_TRACT
  Filled 2016-08-22 (×8): qty 2

## 2016-08-22 MED ORDER — DEXTROSE 5 % IV SOLN
500.0000 mg | Freq: Once | INTRAVENOUS | Status: AC
  Administered 2016-08-22: 500 mg via INTRAVENOUS
  Filled 2016-08-22: qty 500

## 2016-08-22 MED ORDER — OMEGA-3-ACID ETHYL ESTERS 1 G PO CAPS
1.0000 g | ORAL_CAPSULE | Freq: Every day | ORAL | Status: DC
  Administered 2016-08-22 – 2016-08-26 (×4): 1 g via ORAL
  Filled 2016-08-22 (×4): qty 1

## 2016-08-22 MED ORDER — MEMANTINE HCL 10 MG PO TABS
10.0000 mg | ORAL_TABLET | Freq: Every day | ORAL | Status: DC
  Administered 2016-08-22 – 2016-08-26 (×4): 10 mg via ORAL
  Filled 2016-08-22 (×4): qty 1

## 2016-08-22 MED ORDER — DEXTROSE 5 % IV SOLN
1.0000 g | Freq: Once | INTRAVENOUS | Status: AC
  Administered 2016-08-22: 1 g via INTRAVENOUS
  Filled 2016-08-22: qty 10

## 2016-08-22 MED ORDER — LORATADINE 10 MG PO TABS
10.0000 mg | ORAL_TABLET | Freq: Every day | ORAL | Status: DC
  Administered 2016-08-22 – 2016-08-26 (×4): 10 mg via ORAL
  Filled 2016-08-22 (×4): qty 1

## 2016-08-22 MED ORDER — HEPARIN SODIUM (PORCINE) 5000 UNIT/ML IJ SOLN
5000.0000 [IU] | Freq: Three times a day (TID) | INTRAMUSCULAR | Status: DC
  Administered 2016-08-22 – 2016-08-26 (×11): 5000 [IU] via SUBCUTANEOUS
  Filled 2016-08-22 (×11): qty 1

## 2016-08-22 MED ORDER — SODIUM CHLORIDE 0.9 % IV BOLUS (SEPSIS)
1000.0000 mL | Freq: Once | INTRAVENOUS | Status: AC
  Administered 2016-08-22: 1000 mL via INTRAVENOUS

## 2016-08-22 MED ORDER — SODIUM CHLORIDE 0.9 % IV SOLN
INTRAVENOUS | Status: DC
  Administered 2016-08-22: 21:00:00 via INTRAVENOUS

## 2016-08-22 MED ORDER — OSELTAMIVIR PHOSPHATE 75 MG PO CAPS
75.0000 mg | ORAL_CAPSULE | Freq: Once | ORAL | Status: AC
  Administered 2016-08-22: 75 mg via ORAL
  Filled 2016-08-22: qty 1

## 2016-08-22 NOTE — ED Notes (Signed)
Charge nurse was made aware of situation earlier with BM all over the room & I asked if pt could have a sitter, so she pulled an extra sitter to sit with him.  Charge nurse later removed the sitter and the patient was found in the floor by the tech. Side rails were both still up and bed was in a tilted position.  Charge nurse made aware and patient placed back in bed.

## 2016-08-22 NOTE — H&P (Signed)
History and Physical    Nicholas Herring UJW:[id number] DOB: 03/05/1927 DOA: 08/22/2016  Referring Provider: Dr. Verdie MosherLiu PCP: [name], [name], MD   Patient coming from: home   Chief Complaint: SOB, productive cough, N/V and generalized weakness.  HPI: Nicholas Herring is a 83 y.o. male with PMH significant for dementia, sleep apnea, GERD and hx of incarcerated hernia (surgically repaired in 2009), with subsequent SBO requiring laparoscopic lysis of adhesions; who presented to ED with SOB, productive cough, generalized malaise and nausea/vomting. Patient symptoms present for the last 48 hours and worsening. Patient endorses subjective fever and chills. Denies CP, hemoptysis, hematemesis, melena, hematochezia, dysuria, hematuria, HA's, blurred vision and focal weakness.   ED Course: patient CXR done and demonstrated lower lobes infiltrates; patient also with hypoxia on RA. Started on oxygen supplementation, cx's taken and influenza panel collected; patient started on IV antibiotics and tamiflu. [facility] called to admit patient for further evaluation and treatment.  Review of Systems:  All other systems reviewed and apart from HPI, are negative.  Past Medical History:  Diagnosis Date  . Acute urinary retention 2009   Resolved after foley & flomax  . Colon polyps   . Difficulty in urination   . Diverticulosis of sigmoid colon   . Enlarged prostate   . Incarcerated incisional hernia 2009  . Macular degeneration    Right eye  . Osteoarthritis of both knees 08/17/2013  . Seborrheic keratoses, inflamed 2013  . Sleep apnea    pt has CPAP machine but does not wear at bedtime    Past Surgical History:  Procedure Laterality Date  . CATARACT EXTRACTION Left   . CATARACT EXTRACTION W/PHACO Right 05/22/2016   Procedure: CATARACT EXTRACTION PHACO AND INTRAOCULAR LENS PLACEMENT (IOC);  Surgeon: Nevada CraneBradley Mark King, MD;  Location: [facility] ORS;  Service: Ophthalmology;  Laterality: Right;  Lot# [id number] H US:  03:33.8 AP%: 15.8 CDE: 33.90  . CHOLECYSTECTOMY  1987  . ESOPHAGOGASTRODUODENOSCOPY N/A 08/26/2013   Procedure: ESOPHAGOGASTRODUODENOSCOPY (EGD);  Surgeon: Shirley FriarVincent C. Schooler, MD;  [facility];  Service: Endoscopy;  Laterality: N/A;  . INGUINAL HERNIA REPAIR Right infant  . INGUINAL HERNIA REPAIR Bilateral 08/23/2013   Procedure: LAPAROSCOPIC Bilateral INGUINAL HERNIA Repairs and TAPP block    ;  Surgeon: Ardeth SportsmanSteven C. Gross, MD;  Location: [facility];  Service: General;  Laterality: Bilateral;  . INSERTION OF MESH Bilateral 08/23/2013   Procedure: INSERTION OF MESH ;  Surgeon: Ardeth SportsmanSteven C. Gross, MD;  Location: [facility] OR;  Service: General;  Laterality: Bilateral;  . LAPAROSCOPIC ASSISTED VENTRAL HERNIA REPAIR  2009   Dr Ezzard StandingNewman  . LAPAROSCOPIC LYSIS OF ADHESIONS N/A 08/23/2013   Procedure: LAPAROSCOPIC LYSIS OF ADHESIONS;  Surgeon: Ardeth SportsmanSteven C. Gross, MD;  Location: [facility];  Service: General;  Laterality: N/A;  . STOMACH SURGERY  1990   reflux surgery?     reports that he quit smoking about 45 years ago. He has never used smokeless tobacco. He reports that he drinks about 3.6 oz of alcohol per week . He reports that he does not use drugs.  Allergies  Allergen Reactions  . Nsaids Other (See Comments)    Ulceration   . Codeine Other (See Comments)    Unknown reaction  . Morphine And Related Nausea And Vomiting    History reviewed. No pertinent family history.   Prior to Admission medications   Medication Sig Start Date End Date Taking? Authorizing Provider  cetirizine (ZYRTEC) 10 MG tablet Take 10 mg by mouth at bedtime.  Yes Historical Provider, MD  Cholecalciferol (VITAMIN D-3) 1000 units CAPS Take 1,000 Units by mouth every evening.   Yes Historical Provider, MD  diphenhydrAMINE (BENADRYL) 25 MG tablet Take 25 mg by mouth daily after lunch.   Yes Historical Provider, MD  memantine (NAMENDA) 10 MG tablet Take 10 mg by mouth daily.   Yes Historical Provider, MD  Omega-3 Fatty Acids (FISH  OIL) 1200 MG CAPS Take 1,200 mg by mouth every evening.   Yes Historical Provider, MD  triamcinolone cream (KENALOG) 0.1 % Apply 1 application topically 2 (two) times daily as needed for irritation. 05/21/16  Yes Historical Provider, MD    Physical Exam: Vitals:   08/22/16 0759 08/22/16 0952 08/22/16 0956 08/22/16 1139  BP: 132/78   125/63  Pulse: 103   95  Resp: 16   16  Temp: 98.7 F (37.1 C)     TempSrc: Oral     SpO2: 94% (!) 88% 90% 94%  Weight: 83.9 kg (185 lb)     Height: 6' (1.829 m)       Constitutional: in no major distress, with productive intermittent coughing spells on examination and endorsing nausea. No CP. Wearing O2 supplementation by [place]. Eyes: PERTLA, lids and conjunctivae normal, no icterus, no nystagmus  ENMT: Mucous membranes are dry on exam, with chapped lips. Posterior pharynx clear of any exudate or lesions. No thrush  Neck: normal, supple, no masses, no thyromegaly, no JVD Respiratory: positive rhonchi and exp wheezing, no using accessory muscles, positive tachypnea. Cardiovascular: mild sinus tachycardia, no murmurs / rubs / gallops. No extremity edema. 2+ pedal pulses. No carotid bruits.  Abdomen: No distension, no tenderness, no masses palpated. No hepatosplenomegaly. Bowel sounds normal.  Musculoskeletal: no clubbing / cyanosis. No joint deformity upper and lower extremities. Good ROM, no contractures. Normal muscle tone.  Skin: no rashes, bruises or open lesions Neurologic: CN 2-12 grossly intact. Sensation intact, DTR normal. Strength 4/5 in all 4 limbs due to poor effort.  Psychiatric: oriented X2, fair insight and judgment. Normal mood.    Labs on Admission: I have personally reviewed following labs and imaging studies  CBC:  Recent Labs Lab 08/22/16 0833  WBC 13.0*  NEUTROABS 11.5*  HGB 13.6  HCT 40.5  MCV 89.0  PLT 238   Basic Metabolic Panel:  Recent Labs Lab 08/22/16 0833  NA 138  K 3.9  CL 108  CO2 23  GLUCOSE 160*  BUN 19    CREATININE 0.56*  CALCIUM 8.1*   GFR: Estimated Creatinine Clearance: 71.4 mL/min (by C-G formula based on SCr of 0.56 mg/dL (L)).   Liver Function Tests:  Recent Labs Lab 08/22/16 0833  AST 18  ALT 16*  ALKPHOS 112  BILITOT 1.1  PROT 6.2*  ALBUMIN 3.0*    Recent Labs Lab 08/22/16 0833  LIPASE 16   Urine analysis:    Component Value Date/Time   COLORURINE YELLOW 08/22/2016 1044   APPEARANCEUR CLEAR 08/22/2016 1044   LABSPEC 1.023 08/22/2016 1044   PHURINE 5.0 08/22/2016 1044   GLUCOSEU NEGATIVE 08/22/2016 1044   HGBUR NEGATIVE 08/22/2016 1044   BILIRUBINUR NEGATIVE 08/22/2016 1044   KETONESUR NEGATIVE 08/22/2016 1044   PROTEINUR NEGATIVE 08/22/2016 1044   UROBILINOGEN 1.0 08/25/2013 1228   NITRITE NEGATIVE 08/22/2016 1044   LEUKOCYTESUR NEGATIVE 08/22/2016 1044    Radiological Exams on Admission: Dg Chest 2 View  Result Date: 08/22/2016 CLINICAL DATA:  New cough, nausea, vomiting, mid abdominal pain. EXAM: CHEST  2 VIEW COMPARISON:  08/29/2013 FINDINGS: Mild cardiomegaly. Low lung volumes. Bibasilar airspace opacities. No effusions or acute bony abnormality. IMPRESSION: Bibasilar atelectasis or infiltrates.  Low lung volumes. Electronically Signed   By: Charlett Nose M.D.   On: 08/22/2016 09:29   Dg Abd 2 Views  Result Date: 08/22/2016 CLINICAL DATA:  Cough, abdominal pain, nausea, vomiting. EXAM: ABDOMEN - 2 VIEW COMPARISON:  Radiographs of August 29, 2013. FINDINGS: The bowel gas pattern is normal. There is no evidence of free air. Status post cholecystectomy. Status post anterior abdominal wall hernia repair. No radio-opaque calculi or other significant radiographic abnormality is seen. IMPRESSION: No evidence of bowel obstruction or ileus. Electronically Signed   By: Lupita Raider, M.D.   On: 08/22/2016 09:32    EKG:  No acute ischemic changes; sinus rhythm and regular rate  Assessment/Plan 1-Acute respiratory failure with hypoxia (HCC): due to LLL CAP  and most likely influenza. -oxygen supplementation and try to weaned off as tolerated  -will start IV antibiotics -PRN nebulizer and pulmicort  -mucinex and flutter valve -will follow clinical response   2-CAP: -will follow PNA protocol -will check influenza panel, and will empirically start tamiflu  -will check urine cx, blood cx, strep pneumo and legionella antigen in urine  -patient CURB 65 score is 2-3 -will treat with rocephin and zithromax -IVF's and supportive care -will follow clinical response   3-Hiatal hernia, with nausea and vomiting -will use PRN antiemetics -no acute abnormalities on abd x-ray -will use PPI -most likely associated with flu like symptoms and PNA (treatment for that as mentioned above)  4-dementia and deconditioning -will continue namenda -PT/OT evaluation once stable requested  -patient was living home alon (uses a walker for ambulation)  5-Osteoarthritis of both knees -no joint swelling -pain stable -PRN tylenol provided  6-Sleep apnea -not on CPAP -will monitor  7-Hyperglycemia  -will check A1C -no prior hx of diabetes -repeat CBG fasting in am  8-dyslipidemia -will continue fatty acids    Time: 70 minutes   DVT prophylaxis: heparin  Code Status: Full Family Communication: no family at bedside   Disposition Plan: to be determined; once stable will assess capacity to look after himself and decide best/safest discharge pathway  Consults called: none  Admission status: inpatient, LOS > 2 midnights, Lance Coon MD [facility] Pager [phone]  If 7PM-7AM, please contact night-coverage [email or web address] Password [facility]  08/22/2016, 12:42 PM

## 2016-08-22 NOTE — ED Notes (Signed)
Pt attempted to void, given urinal, unable to void at this time. Intervention: 1 L IV fluid administered. Plan: reassess.

## 2016-08-22 NOTE — ED Provider Notes (Signed)
[facility]-EMERGENCY DEPT Provider Note   CSN: [id number] Arrival date & time: 08/22/16  0756     History   Chief Complaint Chief Complaint  Patient presents with  . Emesis    HPI Nicholas Herring is a 83 y.o. male.  HPI  83 year old male who presents with cough, nausea and vomiting. He has a history of OSA, ventral inguinal hernia repair, prior cholecystectomy. States feeling unwell for past 1-2 days with nonbilious nonbloody vomiting. No diarrhea or abdominal distension. Did have epigastric pain with vomiting, but no abdominal pain currently. No dysuria, urinary frequency. Cough productive of thick sputum with subjective fever and chills. No chest pain, leg swelling. Some dyspnea. No known sick contacts. Lives at home by self.  Past Medical History:  Diagnosis Date  . Acute urinary retention 2009   Resolved after foley & flomax  . Colon polyps   . Difficulty in urination   . Diverticulosis of sigmoid colon   . Enlarged prostate   . Incarcerated incisional hernia 2009  . Macular degeneration    Right eye  . Osteoarthritis of both knees 08/17/2013  . Seborrheic keratoses, inflamed 2013  . Sleep apnea    pt has CPAP machine but does not wear at bedtime    Patient Active Problem List   Diagnosis Date Noted  . Influenza-like illness 08/22/2016  . Acute esophagitis 08/27/2013  . Melena 08/26/2013  . Hematemesis 08/25/2013  . Ileus, postoperative (HCC) 08/25/2013  . Hiatal hernia 08/17/2013  . Osteoarthritis of both knees 08/17/2013  . Recurrent bilateral inguinal hernia (BIH) s/p lap repair w mesh 08/23/2013 08/17/2013  . Difficulty in urination     Past Surgical History:  Procedure Laterality Date  . CATARACT EXTRACTION Left   . CATARACT EXTRACTION W/PHACO Right 05/22/2016   Procedure: CATARACT EXTRACTION PHACO AND INTRAOCULAR LENS PLACEMENT (IOC);  Surgeon: Nevada Crane, MD;  Location: [facility] ORS;  Service: Ophthalmology;  Laterality: Right;  Lot# [id number] H [place]:  03:33.8 AP%: 15.8 CDE: 33.90  . CHOLECYSTECTOMY  1987  . ESOPHAGOGASTRODUODENOSCOPY N/A 08/26/2013   Procedure: ESOPHAGOGASTRODUODENOSCOPY (EGD);  Surgeon: Shirley Friar, MD;  Location: [facility];  Service: Endoscopy;  Laterality: N/A;  . INGUINAL HERNIA REPAIR Right infant  . INGUINAL HERNIA REPAIR Bilateral 08/23/2013   Procedure: LAPAROSCOPIC Bilateral INGUINAL HERNIA Repairs and TAPP block    ;  Surgeon: Ardeth Sportsman, MD;  Location: [facility] OR;  Service: General;  Laterality: Bilateral;  . INSERTION OF MESH Bilateral 08/23/2013   Procedure: INSERTION OF MESH ;  Surgeon: Ardeth Sportsman, MD;  Location: [facility] OR;  Service: General;  Laterality: Bilateral;  . LAPAROSCOPIC ASSISTED VENTRAL HERNIA REPAIR  2009   Dr Ezzard Standing  . LAPAROSCOPIC LYSIS OF ADHESIONS N/A 08/23/2013   Procedure: LAPAROSCOPIC LYSIS OF ADHESIONS;  Surgeon: Ardeth Sportsman, MD;  Location: [facility] OR;  Service: General;  Laterality: N/A;  . STOMACH SURGERY  1990   reflux surgery?       Home Medications    Prior to Admission medications   Medication Sig Start Date End Date Taking? Authorizing Provider  cetirizine (ZYRTEC) 10 MG tablet Take 10 mg by mouth at bedtime.   Yes Historical Provider, MD  Cholecalciferol (VITAMIN D-3) 1000 units CAPS Take 1,000 Units by mouth every evening.   Yes Historical Provider, MD  diphenhydrAMINE (BENADRYL) 25 MG tablet Take 25 mg by mouth daily after lunch.   Yes Historical Provider, MD  memantine (NAMENDA) 10 MG tablet Take 10 mg by mouth  daily.   Yes Historical Provider, MD  Omega-3 Fatty Acids (FISH OIL) 1200 MG CAPS Take 1,200 mg by mouth every evening.   Yes Historical Provider, MD  triamcinolone cream (KENALOG) 0.1 % Apply 1 application topically 2 (two) times daily as needed for irritation. 05/21/16  Yes Historical Provider, MD    Family History History reviewed. No pertinent family history. Reviewed, non contributory Social History Social History  Substance Use Topics  .  Smoking status: Former Smoker    Quit date: 08/12/1971  . Smokeless tobacco: Never Used  . Alcohol use 3.6 oz/week    6 Cans of beer per week     Comment: weekly     Allergies   Nsaids; Codeine; and Morphine and related   Review of Systems Review of Systems 10/14 systems reviewed and are negative other than those stated in the HPI   Physical Exam Updated Vital Signs BP 132/78   Pulse 103   Temp 98.7 F (37.1 C) (Oral)   Resp 16   Ht 6' (1.829 m)   Wt 185 lb (83.9 kg)   SpO2 90% Comment: Pt sitting up and asleep.  BMI 25.09 kg/m   Physical Exam Physical Exam  Nursing note and vitals reviewed. Constitutional: non-toxic, and in no acute distress Head: Normocephalic and atraumatic.  Mouth/Throat: Oropharynx is clear. dry mucous membranes Neck: Normal range of motion. Neck supple.  Cardiovascular: Tachycardic rate and regular rhythm.  no edema Pulmonary/Chest: Effort normal and breath sounds normal.  Abdominal: Soft. There is no tenderness. There is no rebound and no guarding.  Musculoskeletal: Normal range of motion.  Neurological: Alert, no facial droop, fluent speech, moves all extremities symmetrically Skin: Skin is warm and dry.  Psychiatric: Cooperative   ED Treatments / Results  Labs (all labs ordered are listed, but only abnormal results are displayed) Labs Reviewed  COMPREHENSIVE METABOLIC PANEL - Abnormal; Notable for the following:       Result Value   Glucose, Bld 160 (*)    Creatinine, Ser 0.56 (*)    Calcium 8.1 (*)    Total Protein 6.2 (*)    Albumin 3.0 (*)    ALT 16 (*)    All other components within normal limits  CBC - Abnormal; Notable for the following:    WBC 13.0 (*)    All other components within normal limits  DIFFERENTIAL - Abnormal; Notable for the following:    Neutro Abs 11.5 (*)    Lymphs Abs 0.6 (*)    All other components within normal limits  LIPASE, BLOOD  URINALYSIS, ROUTINE W REFLEX MICROSCOPIC  INFLUENZA PANEL BY PCR  (TYPE A & B, H1N1)    EKG  EKG Interpretation  Date/Time:  Friday August 22 2016 08:39:21 EST Ventricular Rate:  99 PR Interval:    QRS Duration: 108 QT Interval:  350 QTC Calculation: 450 R Axis:   12 Text Interpretation:  Sinus rhythm Nonspecific T abnormalities, lateral leads similar to prior EKG  Confirmed by Ferlin Fairhurst MD, Susen Haskew ([id number]) on 08/22/2016 9:20:32 AM       Radiology Dg Chest 2 View  Result Date: 08/22/2016 CLINICAL DATA:  New cough, nausea, vomiting, mid abdominal pain. EXAM: CHEST  2 VIEW COMPARISON:  08/29/2013 FINDINGS: Mild cardiomegaly. Low lung volumes. Bibasilar airspace opacities. No effusions or acute bony abnormality. IMPRESSION: Bibasilar atelectasis or infiltrates.  Low lung volumes. Electronically Signed   By: Charlett Nose M.D.   On: 08/22/2016 09:29   Dg Abd 2 Views  Result Date: 08/22/2016 CLINICAL DATA:  Cough, abdominal pain, nausea, vomiting. EXAM: ABDOMEN - 2 VIEW COMPARISON:  Radiographs of August 29, 2013. FINDINGS: The bowel gas pattern is normal. There is no evidence of free air. Status post cholecystectomy. Status post anterior abdominal wall hernia repair. No radio-opaque calculi or other significant radiographic abnormality is seen. IMPRESSION: No evidence of bowel obstruction or ileus. Electronically Signed   By: Lupita RaiderJames  Green Jr, M.D.   On: 08/22/2016 09:32    Procedures Procedures (including critical care time)  Medications Ordered in ED Medications  ondansetron (ZOFRAN) injection 4 mg (4 mg Intravenous Not Given 08/22/16 0950)  cefTRIAXone (ROCEPHIN) 1 g in dextrose 5 % 50 mL IVPB (not administered)  azithromycin (ZITHROMAX) 500 mg in dextrose 5 % 250 mL IVPB (not administered)  oseltamivir (TAMIFLU) capsule 75 mg (not administered)  sodium chloride 0.9 % bolus 1,000 mL (1,000 mLs Intravenous New Bag/Given 08/22/16 0840)     Initial Impression / Assessment and Plan / ED Course  I have reviewed the triage vital signs and the nursing  notes.  Pertinent labs & imaging results that were available during my care of the patient were reviewed by me and considered in my medical decision making (see chart for details).  Clinical Course     Presenting with vomiting, congestion, nonproductive cough over the past 2 days. Afebrile, mildly tachycardic, but normotensive. He was very dry on exam with coarse breath sounds throughout. While resting does have mild hypoxia of 88% on room air, and placed on nasal cannula. Chest x-ray visualized with concern for atelectasis versus mild opacities that could be suggestive of infiltrate. Has a leukocytosis of 13. No major metabolic or electrolyte derangements. No significant kidney injury. Did empirically cover with ceftriaxone and azithromycin given question of potential community-acquired pneumonia. Clinical suspicion for influenza is also high, and influenza swab is pending at this time. Did empirically start Tamiflu. Given his debilitation, and mild oxygen requirement, discussed with Dr. Gwenlyn PerkingMadera who will admit for observation.  Final Clinical Impressions(s) / ED Diagnoses   Final diagnoses:  Nausea and vomiting  Influenza-like illness    New Prescriptions New Prescriptions   No medications on file     Lavera Guiseana Duo Davanta Meuser, MD 08/22/16 1126

## 2016-08-22 NOTE — Progress Notes (Signed)
Pt given Flutter valve.  Pt unable to perform maneuvers without coaching for duration of exercise.  RT will continue to monitor as needed. RT will pass along in report for flutter valve to be encouraged while RT is in room giving breathing treatments.

## 2016-08-22 NOTE — ED Notes (Signed)
Sitter was removed from the room and when I went to check on patient he was on the floor yelling help. Both rails were still up and bed was in a tilted position. Patient placed back in bed with bed alarm now

## 2016-08-22 NOTE — ED Notes (Signed)
Bed: [id number] Expected date:  Expected time:  Means of arrival:  Comments: EMS-N/V/D

## 2016-08-22 NOTE — ED Triage Notes (Signed)
Per EMS from home, c/o nausea, emesis every 10 minutes, not eating well since yesterday. 4 mg Zofran administered IV en route. No blood in emesis. No diarrhea. Wheezing to right lower lung, coarse lung sounds.

## 2016-08-22 NOTE — ED Notes (Signed)
PATIENT HAD BM ALL OVER THE FLOOR, BED, BEDSIDE STOOL, AND HANDS. PULLED IV OUT X2

## 2016-08-23 ENCOUNTER — Inpatient Hospital Stay (HOSPITAL_COMMUNITY): Payer: Medicare Other

## 2016-08-23 DIAGNOSIS — J9601 Acute respiratory failure with hypoxia: Secondary | ICD-10-CM

## 2016-08-23 DIAGNOSIS — R112 Nausea with vomiting, unspecified: Secondary | ICD-10-CM

## 2016-08-23 DIAGNOSIS — J189 Pneumonia, unspecified organism: Secondary | ICD-10-CM

## 2016-08-23 DIAGNOSIS — G4730 Sleep apnea, unspecified: Secondary | ICD-10-CM

## 2016-08-23 LAB — CBC
HEMATOCRIT: 35.8 % — AB (ref 39.0–52.0)
HEMOGLOBIN: 11.6 g/dL — AB (ref 13.0–17.0)
MCH: 28.6 pg (ref 26.0–34.0)
MCHC: 32.4 g/dL (ref 30.0–36.0)
MCV: 88.4 fL (ref 78.0–100.0)
Platelets: 228 10*3/uL (ref 150–400)
RBC: 4.05 MIL/uL — ABNORMAL LOW (ref 4.22–5.81)
RDW: 15.1 % (ref 11.5–15.5)
WBC: 6.8 10*3/uL (ref 4.0–10.5)

## 2016-08-23 LAB — BASIC METABOLIC PANEL
ANION GAP: 5 (ref 5–15)
BUN: 15 mg/dL (ref 6–20)
CO2: 27 mmol/L (ref 22–32)
Calcium: 7.9 mg/dL — ABNORMAL LOW (ref 8.9–10.3)
Chloride: 105 mmol/L (ref 101–111)
Creatinine, Ser: 0.65 mg/dL (ref 0.61–1.24)
GLUCOSE: 110 mg/dL — AB (ref 65–99)
POTASSIUM: 3.5 mmol/L (ref 3.5–5.1)
SODIUM: 137 mmol/L (ref 135–145)

## 2016-08-23 LAB — HIV ANTIBODY (ROUTINE TESTING W REFLEX): HIV Screen 4th Generation wRfx: NONREACTIVE

## 2016-08-23 LAB — STREP PNEUMONIAE URINARY ANTIGEN: Strep Pneumo Urinary Antigen: NEGATIVE

## 2016-08-23 MED ORDER — DEXTROSE 5 % IV SOLN
500.0000 mg | Freq: Once | INTRAVENOUS | Status: AC
  Administered 2016-08-23: 500 mg via INTRAVENOUS
  Filled 2016-08-23: qty 500

## 2016-08-23 MED ORDER — AZITHROMYCIN 250 MG PO TABS
500.0000 mg | ORAL_TABLET | Freq: Every day | ORAL | Status: DC
  Administered 2016-08-24 – 2016-08-26 (×3): 500 mg via ORAL
  Filled 2016-08-23 (×3): qty 2

## 2016-08-23 NOTE — Progress Notes (Signed)
PT [place] Note  Patient Details Name: Nicholas PassyJackson B Swiney MRN: [id number] DOB: 05/21/1927   Treatment:      Eval completed full write up to follow.   Pt is home alone, does have 8 hour help, however may need ST-SNF prior to DC home. Will continue to follow.   Marella BileBRITT, Baraa Tubbs 08/23/2016, 6:44 PM  Marella BileSharron Meghann Landing, PT Pager: [phone] 08/23/2016

## 2016-08-23 NOTE — Progress Notes (Signed)
PROGRESS NOTE  [name]  ZOX:[id number] DOB: [birth year] DOA: 08/22/2016 PCP: Tally Due, MD  Brief Narrative:    SOB, productive cough, N/V and generalized weakness.  HPI: Nicholas Herring is a 83 y.o. male with PMH significant for dementia, sleep apnea, GERD and hx of incarcerated hernia (surgically repaired in 2009), with subsequent SBO requiring laparoscopic lysis of adhesions; who presented to ED with SOB, productive cough, subjective fevers and chills, generalized malaise and nausea/vomiting. CXR demonstrated lower lobes infiltrates; patient also with hypoxia on RA. Started on oxygen supplementation.  Flu negative.  He had an unwitnessed fall resulting in left flank pain.  Repeat XR demonstrated no evidence of fracture, but he likely has some bone bruising.  Awaiting PT/OT evaluations.    Assessment & Plan:   Principal Problem:   Acute respiratory failure with hypoxia (HCC) Active Problems:   Hiatal hernia   Osteoarthritis of both knees   Influenza-like illness   Sleep apnea   Nausea & vomiting   CAP (community acquired pneumonia)   Hyperglycemia  Acute respiratory failure with hypoxia (HCC), nausea, and vomiting, likely due to LLL CAP  -  Wean oxygen today, turned down to 2L while I was in the room - Continue IV antibiotics -  Flu negative so discontinue tamiflu -  S. pneumo negative -  Legionella pending -  BCx NGTD -  Continue zofran and PPI  Ddementia  - continue namenda  Deconditioning and falls, including fall with bone bruising here in hospital -  Falls precautions -  PT/OT evaluation pending -  Repeat XR of chest and spine negative for acute fracture -  Family requesting [facility] at discharge  5-Osteoarthritis of both knees, stable, continue prn tylenol  Sleep apnea, not on CPAP -will monitor  Hyperglycemia  - A1C pending  Dyslipidemia, stable, continue fatty acids    DVT prophylaxis: heparin  Code Status: Full Family  Communication: son and daughter at bedside today Disposition Plan: anticipate to SNF at discharge.  SW consult placed  Consultants:   none  Procedures:  none  Antimicrobials:  Anti-infectives    Start     Dose/Rate Route Frequency Ordered Stop   08/24/16 1000  azithromycin (ZITHROMAX) tablet 500 mg     500 mg Oral Daily 08/23/16 0928 08/29/16 0959   08/23/16 1200  cefTRIAXone (ROCEPHIN) 1 g in dextrose 5 % 50 mL IVPB     1 g 100 mL/hr over 30 Minutes Intravenous Every 24 hours 08/22/16 2033 08/30/16 1159   08/23/16 1200  azithromycin (ZITHROMAX) 500 mg in dextrose 5 % 250 mL IVPB  Status:  Discontinued     500 mg 250 mL/hr over 60 Minutes Intravenous Every 24 hours 08/22/16 2033 08/23/16 0928   08/23/16 1200  azithromycin (ZITHROMAX) 500 mg in dextrose 5 % 250 mL IVPB     500 mg 250 mL/hr over 60 Minutes Intravenous  Once 08/23/16 0928 08/23/16 1312   08/22/16 1130  oseltamivir (TAMIFLU) capsule 75 mg     75 mg Oral  Once 08/22/16 1124 08/22/16 1302   08/22/16 1115  cefTRIAXone (ROCEPHIN) 1 g in dextrose 5 % 50 mL IVPB     1 g 100 mL/hr over 30 Minutes Intravenous  Once 08/22/16 1111 08/22/16 1303   08/22/16 1115  azithromycin (ZITHROMAX) 500 mg in dextrose 5 % 250 mL IVPB     500 mg 250 mL/hr over 60 Minutes Intravenous  Once 08/22/16 1111 08/22/16 1447  Subjective: Having moderate pain over left flank where he states he hit his back on a table.  Pain worsens with deep inspiration.  Confused with memory loss.  Family denies choking or spluttering with meals.  Rhinorrhea present for last several months with dry cough for same duration.  Denies LE numbness, weakness.    Objective: Vitals:   08/22/16 2037 08/23/16 0521 08/23/16 0840 08/23/16 1200  BP: (!) 137/58 124/60    Pulse: 86 80    Resp: 18 18    Temp: 97.9 F (36.6 C) 98 F (36.7 C)    TempSrc: Oral Oral    SpO2: 97% 94% 95% 96%  Weight:      Height:        Intake/Output Summary (Last 24 hours) at  08/23/16 1349 Last data filed at 08/23/16 0857  Gross per 24 hour  Intake             1140 ml  Output              525 ml  Net              615 ml   Filed Weights   08/22/16 0759  Weight: 83.9 kg (185 lb)    Examination:  General exam:  Adult male.  No acute distress.  HEENT:  NCAT, MMM, nares congested.   Respiratory system:  No rales anteriorly, but faint rales under the axilla bilaterally.   Cardiovascular system: Regular rate and rhythm, normal S1/S2. No murmurs, rubs, gallops or clicks.  Warm extremities Gastrointestinal system: Normal active bowel sounds, soft, nondistended, nontender. Chest:  Pain with palpation of the left flank MSK:  Normal tone and bulk, no lower extremity edema Neuro:  5/5 RLE and 5-/5 LLE strength, sensation intact to light touch bilateral lower extremities    Data Reviewed: I have personally reviewed following labs and imaging studies  CBC:  Recent Labs Lab 08/22/16 0833 08/22/16 2127 08/23/16 0623  WBC 13.0* 8.7 6.8  NEUTROABS 11.5*  --   --   HGB 13.6 12.6* 11.6*  HCT 40.5 39.0 35.8*  MCV 89.0 88.4 88.4  PLT 238 244 228   Basic Metabolic Panel:  Recent Labs Lab 08/22/16 0833 08/22/16 2127 08/23/16 0623  NA 138  --  137  K 3.9  --  3.5  CL 108  --  105  CO2 23  --  27  GLUCOSE 160*  --  110*  BUN 19  --  15  CREATININE 0.56* 0.77 0.65  CALCIUM 8.1*  --  7.9*  MG  --  2.1  --   PHOS  --  2.6  --    GFR: Estimated Creatinine Clearance: 71.4 mL/min (by C-G formula based on SCr of 0.65 mg/dL). Liver Function Tests:  Recent Labs Lab 08/22/16 0833  AST 18  ALT 16*  ALKPHOS 112  BILITOT 1.1  PROT 6.2*  ALBUMIN 3.0*    Recent Labs Lab 08/22/16 0833  LIPASE 16   No results for input(s): AMMONIA in the last 168 hours. Coagulation Profile: No results for input(s): INR, PROTIME in the last 168 hours. Cardiac Enzymes: No results for input(s): CKTOTAL, CKMB, CKMBINDEX, TROPONINI in the last 168 hours. BNP (last 3  results) No results for input(s): PROBNP in the last 8760 hours. HbA1C: No results for input(s): HGBA1C in the last 72 hours. CBG: No results for input(s): GLUCAP in the last 168 hours. Lipid Profile: No results for input(s): CHOL, HDL, LDLCALC, TRIG, CHOLHDL,  LDLDIRECT in the last 72 hours. Thyroid Function Tests: No results for input(s): TSH, T4TOTAL, FREET4, T3FREE, THYROIDAB in the last 72 hours. Anemia Panel: No results for input(s): VITAMINB12, FOLATE, FERRITIN, TIBC, IRON, RETICCTPCT in the last 72 hours. Urine analysis:    Component Value Date/Time   COLORURINE YELLOW 08/22/2016 1044   APPEARANCEUR CLEAR 08/22/2016 1044   LABSPEC 1.023 08/22/2016 1044   PHURINE 5.0 08/22/2016 1044   GLUCOSEU NEGATIVE 08/22/2016 1044   HGBUR NEGATIVE 08/22/2016 1044   BILIRUBINUR NEGATIVE 08/22/2016 1044   KETONESUR NEGATIVE 08/22/2016 1044   PROTEINUR NEGATIVE 08/22/2016 1044   UROBILINOGEN 1.0 08/25/2013 1228   NITRITE NEGATIVE 08/22/2016 1044   LEUKOCYTESUR NEGATIVE 08/22/2016 1044   Sepsis Labs: @LABRCNTIP (procalcitonin:4,lacticidven:4)  )No results found for this or any previous visit (from the past 240 hour(s)).    Radiology Studies: Dg Chest 2 View  Result Date: 08/23/2016 CLINICAL DATA:  Per RN, pt had fell yesterday when trying to get out of ED stretcher, found on floor by nurse tech, pt c/o severe LEFT sided back/rib pain. Admitted for worsening dyspnea, cough, and ams. EXAM: CHEST - 2 VIEW COMPARISON:  08/22/2016 FINDINGS: Small bilateral pleural effusions with adjacent atelectasis/ infiltrate posteriorly in both lower lobes.Improved aeration at the left lung base. Patchy airspace opacities in the right mid upper lung persist. Heart size normal.  Tortuous thoracic aorta. No pneumothorax.  Surgical clips in the upper abdomen. Spurring in the lower thoracic spine. IMPRESSION: 1. Small bilateral pleural effusions. 2. Improved left lower lung aeration with persistent right upper and  mid lung airspace opacities. Electronically Signed   By: Corlis Leak  Hassell M.D.   On: 08/23/2016 10:50   Dg Chest 2 View  Result Date: 08/22/2016 CLINICAL DATA:  New cough, nausea, vomiting, mid abdominal pain. EXAM: CHEST  2 VIEW COMPARISON:  08/29/2013 FINDINGS: Mild cardiomegaly. Low lung volumes. Bibasilar airspace opacities. No effusions or acute bony abnormality. IMPRESSION: Bibasilar atelectasis or infiltrates.  Low lung volumes. Electronically Signed   By: Charlett NoseKevin  Dover M.D.   On: 08/22/2016 09:29   Dg Thoracic Spine 2 View  Result Date: 08/23/2016 CLINICAL DATA:  Per RN, pt had fell yesterday when trying to get out of ED stretcher, found on floor by nurse tech, pt c/o severe LEFT sided back/rib pain. Admitted for worsening dyspnea, cough, and ams. EXAM: THORACIC SPINE 2 VIEWS COMPARISON:  08/22/2016 FINDINGS: Mild thoracic dextroscoliosis. No definite fracture. Anterior vertebral endplate spurring at multiple levels in the mid and lower thoracic spine. Spondylitic changes in the visualized lower cervical spine. Surgical clips and sutures in the visualized mid abdomen. IMPRESSION: 1. Negative for fracture or other acute bone abnormality. Electronically Signed   By: Corlis Leak  Hassell M.D.   On: 08/23/2016 10:51   Dg Abd 2 Views  Result Date: 08/22/2016 CLINICAL DATA:  Cough, abdominal pain, nausea, vomiting. EXAM: ABDOMEN - 2 VIEW COMPARISON:  Radiographs of August 29, 2013. FINDINGS: The bowel gas pattern is normal. There is no evidence of free air. Status post cholecystectomy. Status post anterior abdominal wall hernia repair. No radio-opaque calculi or other significant radiographic abnormality is seen. IMPRESSION: No evidence of bowel obstruction or ileus. Electronically Signed   By: Lupita RaiderJames  Green Jr, M.D.   On: 08/22/2016 09:32     Scheduled Meds: . [START ON 08/24/2016] azithromycin  500 mg Oral Daily  . budesonide (PULMICORT) nebulizer solution  0.25 mg Nebulization BID  . cefTRIAXone (ROCEPHIN)  IV   1 g Intravenous Q24H  . guaiFENesin  600 mg Oral BID  . heparin  5,000 Units Subcutaneous Q8H  . Influenza vac split quadrivalent PF  0.5 mL Intramuscular Tomorrow-1000  . loratadine  10 mg Oral Daily  . memantine  10 mg Oral Daily  . omega-3 acid ethyl esters  1 g Oral Daily  . pantoprazole  40 mg Oral Q1200   Continuous Infusions:   LOS: 1 day    Time spent: 30 min    Renae Fickle, MD [facility] Pager [phone]  If 7PM-7AM, please contact night-coverage [email or web address] Password [email or web address] 08/23/2016, 1:49 PM

## 2016-08-23 NOTE — Evaluation (Signed)
Physical Therapy Evaluation Patient Details Name: Nicholas PassyJackson B Grajales MRN: [id number] DOB: 01/01/1927 Today's Date: 08/23/2016   History of Present Illness  Pt admit with acute respiratory failure with hypoxia.   Clinical Impression  PT with generalized weakness and decreased safety with balance and mobility, to benefit from further PT to increase safety with mobility.     Follow Up Recommendations SNF (ST-SNF , with goal to return home )    Equipment Recommendations  None recommended by PT    Recommendations for Other Services       Precautions / Restrictions Precautions Precautions: Fall Precaution Comments: fell here at the hospital as well, stated he hit his left side bottom rib area, near his large hernia area.  Restrictions Weight Bearing Restrictions: No      Mobility  Bed Mobility Overal bed mobility: Needs Assistance Bed Mobility: Supine to Sit;Sit to Supine     Supine to sit: Min assist Sit to supine: Min assist   General bed mobility comments: assit eith hips and LEs .   Transfers Overall transfer level: Needs assistance Equipment used: 4-wheeled walker Transfers: Sit to/from Stand Sit to Stand: Min assist            Ambulation/Gait Ambulation/Gait assistance: Min assist Ambulation Distance (Feet): 50 Feet Assistive device: Rolling walker (2 wheeled) Gait Pattern/deviations: Step-through pattern     General Gait Details: small steps , very slow gait, step to pattern leading with the, no step through on the right at this time   Stairs            Wheelchair Mobility    Modified Rankin (Stroke Patients Only)       Balance Overall balance assessment: Needs assistance;History of Falls                                           Pertinent Vitals/Pain Pain Assessment: 0-10 Pain Score: 4  Pain Location: LEft flank area as described above  Pain Descriptors / Indicators: Aching Pain Intervention(s): Monitored during  session    Home Living Family/patient expects to be discharged to:: Skilled nursing facility (ST-SNF prior to plan to DC back home. ) Living Arrangements: Alone                    Prior Function Level of Independence: Independent with assistive device(s)         Comments: currntly at home alone and has caregivers 8 hr/day and uses 4 wheeled RW . Caregivers assist into the shower and with higher levels skills .      Hand Dominance        Extremity/Trunk Assessment        Lower Extremity Assessment Lower Extremity Assessment: Generalized weakness       Communication   Communication: No difficulties  Cognition Arousal/Alertness: Awake/alert Behavior During Therapy: WFL for tasks assessed/performed Overall Cognitive Status: Within Functional Limits for tasks assessed                      General Comments      Exercises     Assessment/Plan    PT Assessment Patient needs continued PT services  PT Problem List Decreased strength;Decreased activity tolerance;Decreased balance;Decreased mobility          PT Treatment Interventions Gait training;Functional mobility training;Therapeutic activities;Therapeutic exercise;Patient/family education    PT Goals (Current goals can  be found in the Care Plan section)  Acute Rehab PT Goals Patient Stated Goal: i want to be able to get up and walk again  PT Goal Formulation: With patient Time For Goal Achievement: 09/06/16 Potential to Achieve Goals: Good    Frequency Min 3X/week   Barriers to discharge        Co-evaluation               End of Session Equipment Utilized During Treatment: Gait belt Activity Tolerance: Patient tolerated treatment well Patient left: in chair;with call bell/phone within reach;with family/visitor present Nurse Communication: Mobility status         Time: 1530-1559 PT Time Calculation (min) (ACUTE ONLY): 29 min   Charges:   PT Evaluation $PT Eval Low  Complexity: 1 Procedure PT Treatments $Gait Training: 8-22 mins   PT G CodesMarella Bile 30-Aug-2016, 9:01 PM  Marella Bile, PT Pager: [phone] 08/30/16

## 2016-08-23 NOTE — Progress Notes (Signed)
PHARMACIST - PHYSICIAN COMMUNICATION DR:   Malachi BondsShort CONCERNING: Antibiotic IV to Oral Route Change Policy  RECOMMENDATION: This patient is receiving Zithromax by the intravenous route.  Based on criteria approved by the Pharmacy and Therapeutics Committee, the antibiotic(s) is/are being converted to the equivalent oral dose form(s).   DESCRIPTION: These criteria include:  Patient being treated for a respiratory tract infection, urinary tract infection, cellulitis or clostridium difficile associated diarrhea if on metronidazole  The patient is not neutropenic and does not exhibit a GI malabsorption state  The patient is eating (either orally or via tube) and/or has been taking other orally administered medications for a least 24 hours  The patient is improving clinically and has a Tmax < 100.5  If you have questions about this conversion, please contact the Pharmacy Department  []   [phone]( [phone] )  [facility] []   [phone]( [phone] )  [facility] []   [phone]( [phone] )  [facility] []   [phone]( [phone] )  [facility] [x]   [phone]( [phone] )  [facility]   *Change to PO starting 1/14.  Junita PushMichelle Markon Jares, PharmD, BCPS Pager: [phone] 08/23/2016@9 :29 AM

## 2016-08-24 LAB — URINE CULTURE: CULTURE: NO GROWTH

## 2016-08-24 LAB — HEMOGLOBIN A1C
Hgb A1c MFr Bld: 5.5 % (ref 4.8–5.6)
Mean Plasma Glucose: 111 mg/dL

## 2016-08-24 MED ORDER — ACETAMINOPHEN 500 MG PO TABS
1000.0000 mg | ORAL_TABLET | Freq: Three times a day (TID) | ORAL | Status: DC
  Administered 2016-08-24 – 2016-08-26 (×7): 1000 mg via ORAL
  Filled 2016-08-24 (×7): qty 2

## 2016-08-24 MED ORDER — ENSURE ENLIVE PO LIQD
237.0000 mL | Freq: Two times a day (BID) | ORAL | Status: DC
  Administered 2016-08-24 – 2016-08-26 (×5): 237 mL via ORAL

## 2016-08-24 MED ORDER — LIDOCAINE 5 % EX PTCH
1.0000 | MEDICATED_PATCH | CUTANEOUS | Status: DC
  Administered 2016-08-24 – 2016-08-26 (×3): 1 via TRANSDERMAL
  Filled 2016-08-24 (×3): qty 1

## 2016-08-24 MED ORDER — KCL IN DEXTROSE-NACL 10-5-0.45 MEQ/L-%-% IV SOLN
INTRAVENOUS | Status: DC
  Administered 2016-08-24: 13:00:00 via INTRAVENOUS
  Filled 2016-08-24 (×2): qty 1000

## 2016-08-24 NOTE — Evaluation (Signed)
Occupational Therapy Evaluation Patient Details Name: Nicholas Herring MRN: [id number] DOB: Jul 10, 1927 Today's Date: 08/24/2016    History of Present Illness Pt admitted with acute respiratory failure with hypoxia. H/O dementia   Clinical Impression   This 83 year old man was admitted for the above.  He had caregivers daily for 8 hours prior to admission.  He will benefit from continued OT to increase strength, endurance and balance for ADLs. Goals in acute are for min guard to min A.  He currently needs up to total A for LB adls and mod A for sit to stand for transfers.  Pt remains a high falls risk    Follow Up Recommendations  SNF    Equipment Recommendations  3 in 1 bedside commode    Recommendations for Other Services       Precautions / Restrictions Precautions Precautions: Fall Precaution Comments: fell here at the hospital as well, stated he hit his left side bottom rib area, near his large hernia area.  Restrictions Weight Bearing Restrictions: No      Mobility Bed Mobility         Supine to sit: Mod assist;HOB elevated     General bed mobility comments: assist for hips and trunk  Transfers   Equipment used: Rolling walker (2 wheeled)   Sit to Stand: Mod assist         General transfer comment: posterior lean, cues for UE, LE under him and leaning forward to stand.  Assist to rise and stabilize    Balance                                            ADL Overall ADL's : Needs assistance/impaired     Grooming: Oral care;Minimal assistance;Sitting (to apply toothpaste)   Upper Body Bathing: Minimal assistance;Sitting   Lower Body Bathing: Maximal assistance;Sit to/from stand   Upper Body Dressing : Minimal assistance;Sitting   Lower Body Dressing: Total assistance;Sit to/from stand   Toilet Transfer: Moderate assistance;Ambulation;RW (to recliner)             General ADL Comments: pt had strong posterior lean when  standing.  C/o pain in hip/buttocks and RN brought patch.  He has visual difficulty:  macular degeneration and color blindness but son reports he can use call bell.  Ambulated to sink , but pt did not feel strong enough to brush teeth there.     Vision     Perception     Praxis      Pertinent Vitals/Pain Pain Score: 6  Pain Location: L hip/buttocks Pain Descriptors / Indicators: Aching Pain Intervention(s): Limited activity within patient's tolerance;Monitored during session;Repositioned;RN gave pain meds during session     Hand Dominance     Extremity/Trunk Assessment Upper Extremity Assessment Upper Extremity Assessment: Overall WFL for tasks assessed (grossly 4/5)           Communication Communication Communication: No difficulties   Cognition Arousal/Alertness: Awake/alert Behavior During Therapy: WFL for tasks assessed/performed Overall Cognitive Status: Within Functional Limits for tasks assessed                     General Comments       Exercises       Shoulder Instructions      Home Living Family/patient expects to be discharged to:: Skilled nursing facility Living Arrangements: Alone  Prior Functioning/Environment Level of Independence: Independent with assistive device(s)        Comments: currntly at home alone and has caregivers 8 hr/day and uses 4 wheeled RW . Caregivers assist into the shower and with higher levels skills .         OT Problem List: Decreased strength;Decreased activity tolerance;Impaired balance (sitting and/or standing);Decreased knowledge of use of DME or AE;Pain   OT Treatment/Interventions: Self-care/ADL training;DME and/or AE instruction;Patient/family education;Balance training;Therapeutic activities    OT Goals(Current goals can be found in the care plan section) Acute Rehab OT Goals Patient Stated Goal: i want to be able to get up and walk again  OT Goal  Formulation: With patient Time For Goal Achievement: 09/07/16 Potential to Achieve Goals: Good ADL Goals Pt Will Perform Grooming: with min guard assist;standing Pt Will Transfer to Toilet: with min assist;ambulating;bedside commode Pt Will Perform Toileting - Clothing Manipulation and hygiene: with min assist;sit to/from stand Additional ADL Goal #1: pt will perform bed mobility with min A in preparation for adls  OT Frequency: Min 2X/week   Barriers to D/C:            Co-evaluation              End of Session    Activity Tolerance: Patient tolerated treatment well Patient left: in chair;with call bell/phone within reach;with chair alarm set;with family/visitor present (tele sitter)   Time: [id number] OT Time Calculation (min): 29 min Charges:  OT General Charges $OT Visit: 1 Procedure OT Evaluation $OT Eval Low Complexity: 1 Procedure OT Treatments $Self Care/Home Management : 8-22 mins G-Codes:    Almarie Kurdziel 08/24/2016, 11:42 AM Marica OtterMaryellen Dalessandro Baldyga, OTR/L [phone] 08/24/2016

## 2016-08-24 NOTE — Progress Notes (Addendum)
PROGRESS NOTE  Nicholas Herring  RUE:[id number] DOB: 25-Sep-1926 DOA: 08/22/2016 PCP: Tally Due, MD  Brief Narrative:    SOB, productive cough, N/V and generalized weakness.  HPI: Nicholas Herring is a 83 y.o. male with PMH significant for dementia, sleep apnea, GERD and hx of incarcerated hernia (surgically repaired in 2009), with subsequent SBO requiring laparoscopic lysis of adhesions; who presented to ED with SOB, productive cough, subjective fevers and chills, generalized malaise and nausea/vomiting. CXR demonstrated lower lobes infiltrates; patient also with hypoxia on RA. Started on oxygen supplementation.  Flu negative.  He had an unwitnessed fall resulting in left flank pain.  Repeat XR demonstrated no evidence of fracture, but he likely has some bone bruising.  Having nausea preventing PO intake, but anticipate ready for discharge on Monday.    Assessment & Plan:   Principal Problem:   Acute respiratory failure with hypoxia (HCC) Active Problems:   Hiatal hernia   Osteoarthritis of both knees   Influenza-like illness   Sleep apnea   Nausea & vomiting   CAP (community acquired pneumonia)   Hyperglycemia  Acute respiratory failure with hypoxia (HCC), nausea, and vomiting, likely due to LLL CAP.  Very poor PO intake and oliguric yesterday ( uop all day yesterday). -  O2 sats low normal at rest on RA - resume IV antibiotics -  Flu negative so discontinue tamiflu -  S. pneumo negative -  Legionella pending -  BCx NGTD -  Continue zofran and PPI  Dementia  - continue namenda  Deconditioning and falls, including fall with bone bruising here in hospital -  Falls precautions -  PT/OT evaluation recommending SNF -  Repeat XR of chest and spine negative for acute fracture -  Family requesting [facility] at discharge, social work aware  5-Osteoarthritis of both knees, stable, continue prn tylenol  Sleep apnea, not on CPAP -will  monitor  Hyperglycemia  - A1C pending  Dyslipidemia, stable, continue fatty acids    DVT prophylaxis: heparin  Code Status: Full Family Communication: son and daughter at bedside today Disposition Plan: anticipate to SNF tomorrow if tolerating PO.    Consultants:   none  Procedures:  none  Antimicrobials:  Anti-infectives    Start     Dose/Rate Route Frequency Ordered Stop   08/24/16 1000  azithromycin (ZITHROMAX) tablet 500 mg     500 mg Oral Daily 08/23/16 0928 08/29/16 0959   08/23/16 1200  cefTRIAXone (ROCEPHIN) 1 g in dextrose 5 % 50 mL IVPB     1 g 100 mL/hr over 30 Minutes Intravenous Every 24 hours 08/22/16 2033 08/30/16 1159   08/23/16 1200  azithromycin (ZITHROMAX) 500 mg in dextrose 5 % 250 mL IVPB  Status:  Discontinued     500 mg 250 mL/hr over 60 Minutes Intravenous Every 24 hours 08/22/16 2033 08/23/16 0928   08/23/16 1200  azithromycin (ZITHROMAX) 500 mg in dextrose 5 % 250 mL IVPB     500 mg 250 mL/hr over 60 Minutes Intravenous  Once 08/23/16 0928 08/23/16 1312   08/22/16 1130  oseltamivir (TAMIFLU) capsule 75 mg     75 mg Oral  Once 08/22/16 1124 08/22/16 1302   08/22/16 1115  cefTRIAXone (ROCEPHIN) 1 g in dextrose 5 % 50 mL IVPB     1 g 100 mL/hr over 30 Minutes Intravenous  Once 08/22/16 1111 08/22/16 1303   08/22/16 1115  azithromycin (ZITHROMAX) 500 mg in dextrose 5 % 250 mL IVPB  500 mg 250 mL/hr over 60 Minutes Intravenous  Once 08/22/16 1111 08/22/16 1447       Subjective: Having moderate pain over left flank where he states he hit his back on a table.  Pain worsens with deep inspiration.  Denies shortness of breath, chest pains. Having some nausea and does not feel like eating breakfast this morning. No vomiting or diarrhea overnight.  Objective: Vitals:   08/23/16 1449 08/23/16 2227 08/24/16 0454 08/24/16 0801  BP: 116/61 123/68 130/73   Pulse: 77 82 86   Resp: 18 19 18    Temp: 97.9 F (36.6 C) 98.4 F (36.9 C) 98 F (36.7  C)   TempSrc: Oral Oral Oral   SpO2: 96% 92% 94% 91%  Weight:      Height:        Intake/Output Summary (Last 24 hours) at 08/24/16 1211 Last data filed at 08/23/16 2129  Gross per 24 hour  Intake              150 ml  Output                0 ml  Net              150 ml   Filed Weights   08/22/16 0759  Weight: 83.9 kg (185 lb)    Examination:  General exam:  Adult male.  No acute distress.  HEENT:  NCAT, MMM   Respiratory system:  No rales anteriorly, but persistent rales under the axilla bilaterally.   Cardiovascular system: Regular rate and rhythm, normal S1/S2. No murmurs, rubs, gallops or clicks.  Warm extremities Gastrointestinal system: Proactive bowel sounds, soft, nondistended, nontender. Chest:  Pain with palpation of the left flank, no obvious superficial bruising MSK:  Normal tone and bulk, no lower extremity edema Neuro:  5/5 RLE and 5-/5 LLE strength, sensation intact to light touch bilateral lower extremities    Data Reviewed: I have personally reviewed following labs and imaging studies  CBC:  Recent Labs Lab 08/22/16 0833 08/22/16 2127 08/23/16 0623  WBC 13.0* 8.7 6.8  NEUTROABS 11.5*  --   --   HGB 13.6 12.6* 11.6*  HCT 40.5 39.0 35.8*  MCV 89.0 88.4 88.4  PLT 238 244 228   Basic Metabolic Panel:  Recent Labs Lab 08/22/16 0833 08/22/16 2127 08/23/16 0623  NA 138  --  137  K 3.9  --  3.5  CL 108  --  105  CO2 23  --  27  GLUCOSE 160*  --  110*  BUN 19  --  15  CREATININE 0.56* 0.77 0.65  CALCIUM 8.1*  --  7.9*  MG  --  2.1  --   PHOS  --  2.6  --    GFR: Estimated Creatinine Clearance: 71.4 mL/min (by C-G formula based on SCr of 0.65 mg/dL). Liver Function Tests:  Recent Labs Lab 08/22/16 0833  AST 18  ALT 16*  ALKPHOS 112  BILITOT 1.1  PROT 6.2*  ALBUMIN 3.0*    Recent Labs Lab 08/22/16 0833  LIPASE 16   No results for input(s): AMMONIA in the last 168 hours. Coagulation Profile: No results for input(s): INR,  PROTIME in the last 168 hours. Cardiac Enzymes: No results for input(s): CKTOTAL, CKMB, CKMBINDEX, TROPONINI in the last 168 hours. BNP (last 3 results) No results for input(s): PROBNP in the last 8760 hours. HbA1C: No results for input(s): HGBA1C in the last 72 hours. CBG: No results for input(s):  GLUCAP in the last 168 hours. Lipid Profile: No results for input(s): CHOL, HDL, LDLCALC, TRIG, CHOLHDL, LDLDIRECT in the last 72 hours. Thyroid Function Tests: No results for input(s): TSH, T4TOTAL, FREET4, T3FREE, THYROIDAB in the last 72 hours. Anemia Panel: No results for input(s): VITAMINB12, FOLATE, FERRITIN, TIBC, IRON, RETICCTPCT in the last 72 hours. Urine analysis:    Component Value Date/Time   COLORURINE YELLOW 08/22/2016 1044   APPEARANCEUR CLEAR 08/22/2016 1044   LABSPEC 1.023 08/22/2016 1044   PHURINE 5.0 08/22/2016 1044   GLUCOSEU NEGATIVE 08/22/2016 1044   HGBUR NEGATIVE 08/22/2016 1044   BILIRUBINUR NEGATIVE 08/22/2016 1044   KETONESUR NEGATIVE 08/22/2016 1044   PROTEINUR NEGATIVE 08/22/2016 1044   UROBILINOGEN 1.0 08/25/2013 1228   NITRITE NEGATIVE 08/22/2016 1044   LEUKOCYTESUR NEGATIVE 08/22/2016 1044   Sepsis Labs: @LABRCNTIP (procalcitonin:4,lacticidven:4)  ) Recent Results (from the past 240 hour(s))  Culture, blood (routine x 2) Call MD if unable to obtain prior to antibiotics being given     Status: None (Preliminary result)   Collection Time: 08/22/16  9:27 PM  Result Value Ref Range Status   Specimen Description BLOOD LEFT ANTECUBITAL  Final   Special Requests BOTTLES DRAWN AEROBIC AND ANAEROBIC 10CC EACH  Final   Culture   Final    NO GROWTH 1 DAY Performed at [facility]    Report Status PENDING  Incomplete  Culture, blood (routine x 2) Call MD if unable to obtain prior to antibiotics being given     Status: None (Preliminary result)   Collection Time: 08/22/16  9:30 PM  Result Value Ref Range Status   Specimen Description BLOOD RIGHT  HAND  Final   Special Requests BOTTLES DRAWN AEROBIC ONLY 8CC  Final   Culture   Final    NO GROWTH 1 DAY Performed at [facility]    Report Status PENDING  Incomplete  Urine culture     Status: None   Collection Time: 08/22/16  9:35 PM  Result Value Ref Range Status   Specimen Description URINE, RANDOM  Final   Special Requests NONE  Final   Culture NO GROWTH Performed at [facility]   Final   Report Status 08/24/2016 FINAL  Final      Radiology Studies: Dg Chest 2 View  Result Date: 08/23/2016 CLINICAL DATA:  Per RN, pt had fell yesterday when trying to get out of ED stretcher, found on floor by nurse tech, pt c/o severe LEFT sided back/rib pain. Admitted for worsening dyspnea, cough, and ams. EXAM: CHEST - 2 VIEW COMPARISON:  08/22/2016 FINDINGS: Small bilateral pleural effusions with adjacent atelectasis/ infiltrate posteriorly in both lower lobes.Improved aeration at the left lung base. Patchy airspace opacities in the right mid upper lung persist. Heart size normal.  Tortuous thoracic aorta. No pneumothorax.  Surgical clips in the upper abdomen. Spurring in the lower thoracic spine. IMPRESSION: 1. Small bilateral pleural effusions. 2. Improved left lower lung aeration with persistent right upper and mid lung airspace opacities. Electronically Signed   By: Corlis Leak M.D.   On: 08/23/2016 10:50   Dg Thoracic Spine 2 View  Result Date: 08/23/2016 CLINICAL DATA:  Per RN, pt had fell yesterday when trying to get out of ED stretcher, found on floor by nurse tech, pt c/o severe LEFT sided back/rib pain. Admitted for worsening dyspnea, cough, and ams. EXAM: THORACIC SPINE 2 VIEWS COMPARISON:  08/22/2016 FINDINGS: Mild thoracic dextroscoliosis. No definite fracture. Anterior vertebral endplate spurring at multiple levels  in the mid and lower thoracic spine. Spondylitic changes in the visualized lower cervical spine. Surgical clips and sutures in the visualized mid abdomen.  IMPRESSION: 1. Negative for fracture or other acute bone abnormality. Electronically Signed   By: Corlis Leak M.D.   On: 08/23/2016 10:51     Scheduled Meds: . acetaminophen  1,000 mg Oral Q8H  . azithromycin  500 mg Oral Daily  . budesonide (PULMICORT) nebulizer solution  0.25 mg Nebulization BID  . cefTRIAXone (ROCEPHIN)  IV  1 g Intravenous Q24H  . feeding supplement (ENSURE ENLIVE)  237 mL Oral BID BM  . guaiFENesin  600 mg Oral BID  . heparin  5,000 Units Subcutaneous Q8H  . Influenza vac split quadrivalent PF  0.5 mL Intramuscular Tomorrow-1000  . lidocaine  1 patch Transdermal Q24H  . loratadine  10 mg Oral Daily  . memantine  10 mg Oral Daily  . omega-3 acid ethyl esters  1 g Oral Daily  . pantoprazole  40 mg Oral Q1200   Continuous Infusions:   LOS: 2 days    Time spent: 30 min    Renae Fickle, MD [facility] Pager [phone]  If 7PM-7AM, please contact night-coverage [email or web address] Password [facility] 08/24/2016, 12:11 PM

## 2016-08-24 NOTE — Progress Notes (Signed)
Post void residual at this time via bladder scanner was 121cc.  Pt voided prior to scan 100cc.

## 2016-08-25 DIAGNOSIS — R739 Hyperglycemia, unspecified: Secondary | ICD-10-CM

## 2016-08-25 DIAGNOSIS — R69 Illness, unspecified: Secondary | ICD-10-CM

## 2016-08-25 LAB — BASIC METABOLIC PANEL
ANION GAP: 6 (ref 5–15)
BUN: 9 mg/dL (ref 6–20)
CHLORIDE: 103 mmol/L (ref 101–111)
CO2: 27 mmol/L (ref 22–32)
Calcium: 8.3 mg/dL — ABNORMAL LOW (ref 8.9–10.3)
Creatinine, Ser: 0.68 mg/dL (ref 0.61–1.24)
GFR calc non Af Amer: >60 mL/min (ref >60)
GLUCOSE: 113 mg/dL — AB (ref 65–99)
Potassium: 3.7 mmol/L (ref 3.5–5.1)
Sodium: 136 mmol/L (ref 135–145)

## 2016-08-25 LAB — LEGIONELLA PNEUMOPHILA SEROGP 1 UR AG: L. pneumophila Serogp 1 Ur Ag: NEGATIVE

## 2016-08-25 MED ORDER — HALOPERIDOL LACTATE 5 MG/ML IJ SOLN: 0.5000 mg | Freq: Four times a day (QID) | INTRAMUSCULAR | Status: DC | PRN

## 2016-08-25 NOTE — Progress Notes (Signed)
Initial Nutrition Assessment  DOCUMENTATION CODES:   Non-severe (moderate) malnutrition in context of chronic illness  INTERVENTION:   Continue Ensure Enlive po BID, each supplement provides 350 kcal and 20 grams of protein Ordered a snack for patient Encourage PO intake RD to continue to monitor  NUTRITION DIAGNOSIS:   Malnutrition related to chronic illness as evidenced by moderate depletion of body fat, severe depletion of muscle mass.  GOAL:   Patient will meet greater than or equal to 90% of their needs  MONITOR:   PO intake, Supplement acceptance, Labs, Weight trends, I & O's  REASON FOR ASSESSMENT:   Consult Assessment of nutrition requirement/status  ASSESSMENT:   83 y.o. male with PMH significant for dementia, sleep apnea, GERD and hx of incarcerated hernia (surgically repaired in 2009), with subsequent SBO requiring laparoscopic lysis of adhesions; who presented to ED with SOB, productive cough, subjective fevers and chills, generalized malaise and nausea/vomiting. CXR demonstrated lower lobes infiltrates; patient also with hypoxia on RA. Started on oxygen supplementation.  Flu negative.  He had an unwitnessed fall resulting in left flank pain.  Repeat XR demonstrated no evidence of fracture, but he likely has some bone bruising.   Patient in room with family at bedside. Per pt's son, pt last ate some lunch on 1/13 and has not eaten much since that day. Pt developed N/V 1/14. So far today pt states he does not feel nauseous. Pt states he is being cautious about what he eats because he does not want to throw up. Pt denies chewing, swallowing or taste problems. Pt's appetite is still not improved today. Pt's son states pt did not eat anything this morning. Pt would like a snack of cookies and a banana. RD ordered via [employer]. Pt is sipping on a Ensure as well. Encouraged pt to continue to drink his supplements.   Pt with no weight loss per chart review.  Nutrition-Focused physical exam completed. Findings are moderate fat depletion, severe muscle depletion, and no edema.    Labs reviewed. Medications: Protonix tablet daily, IV Zofran PRN  Diet Order:  Diet Heart Room service appropriate? Yes; Fluid consistency: Thin  Skin:  Reviewed, no issues  Last BM:  1/13  Height:   Ht Readings from Last 1 Encounters:  08/22/16 6' (1.829 m)    Weight:   Wt Readings from Last 1 Encounters:  08/22/16 185 lb (83.9 kg)    Ideal Body Weight:  80.9 kg  BMI:  Body mass index is 25.09 kg/m.  Estimated Nutritional Needs:   Kcal:  1800-2000  Protein:  90-100g  Fluid:  1.8L/day  EDUCATION NEEDS:   No education needs identified at this time  Nicholas FrancoLindsey Dontay Harm, MS, RD, LDN Pager: [phone] After Hours Pager: [phone]

## 2016-08-25 NOTE — Clinical Social Work Note (Signed)
Clinical Social Work Assessment  Patient Details  Name: Nicholas Herring MRN: [id number] Date of Birth: 04-Jun-1927  Date of referral:  08/25/16               Reason for consult:  Facility Placement                Permission sought to share information with:  Family Supports, Customer service manager Permission granted to share information::  Yes, Verbal Permission Granted  Name::     Nicholas Herring/ [employer]  Agency::  SNF  Relationship::  Daughters/ Son  Sport and exercise psychologist Information:  [phone]  Housing/Transportation Living arrangements for the past 2 months:  Single Family Home Source of Information:  Adult Children Patient Interpreter Needed:  None Criminal Activity/Legal Involvement Pertinent to Current Situation/Hospitalization:  No - Comment as needed Significant Relationships:  Adult Children Lives with:  Self Do you feel safe going back to the place where you live?  No Need for family participation in patient care:  Yes (Comment)  Care giving concerns:  Patient family is concerned patient cannot return home. Patient family reports he has 24/7 caregivers but at this time he needs more supervision and care.    Social Worker assessment / plan:  LCSWA met patient daughter Nicholas Hayward and son in law at bedside, explained reason for consult-SNF intervention and rehab. Patient agreeable to placement. Daughter reports she has been talking with [name] at [facility] about patient placement there. Family understands this is short term placement, but mentioned [name] has given longterm option if needed.  LCSWA provided daughter and son contact card for questions. Plan: Assist with patient placement at SNF. Complete FL2 and PASRR.    Employment status:  Retired Nurse, adult PT Recommendations:  [place] / Referral to community resources:  [place]  Patient/Family's Response to care:  Agreeable to patient  care and SNF for rehab.   Patient/Family's Understanding of and Emotional Response to Diagnosis, Current Treatment, and Prognosis:  Patient daughters are very involved in patient care, they understand patient will need rehab before retuning home and have been arranging placement with [facility].  Emotional Assessment Appearance:  Appears stated age Attitude/Demeanor/Rapport:    Affect (typically observed):  Calm Orientation:  Oriented to Self Alcohol / Substance use:  Not Applicable Psych involvement (Current and /or in the community):  No (Comment)  Discharge Needs  Concerns to be addressed:  Discharge Planning Concerns, Care Coordination Readmission within the last 30 days:  No Current discharge risk:  Dependent with Mobility Barriers to Discharge:  Continued Medical Work up, Requiring sitter/restraints   Lia Hopping, LCSW 08/25/2016, 9:46 AM

## 2016-08-25 NOTE — Progress Notes (Signed)
PROGRESS NOTE  Nicholas Herring  [id number] DOB: 06/29/1927 DOA: 08/22/2016 PCP: [name], [name], MD  Brief Narrative:    SOB, productive cough, N/V and generalized weakness.  HPI: Nicholas Herring is a 83 y.o. male with PMH significant for dementia, sleep apnea, GERD and hx of incarcerated hernia (surgically repaired in 2009), with subsequent SBO requiring laparoscopic lysis of adhesions; who presented to ED with SOB, productive cough, subjective fevers and chills, generalized malaise and nausea/vomiting. CXR demonstrated lower lobes infiltrates; patient also with hypoxia on RA. Started on oxygen supplementation.  Flu negative.  He had an unwitnessed fall resulting in left flank pain.  Repeat XR demonstrated no evidence of fracture, but he likely has some bone bruising.  Improved oral intake.  Assessment & Plan:   Principal Problem:   Acute respiratory failure with hypoxia (HCC) Active Problems:   Hiatal hernia   Osteoarthritis of both knees   Influenza-like illness   Sleep apnea   Nausea & vomiting   CAP (community acquired pneumonia)   Hyperglycemia  Acute respiratory failure with hypoxia (HCC), nausea, and vomiting, likely due to LLL CAP.  Improved oral intake today -  Continue antibiotics -  Flu negative so discontinue tamiflu -  S. pneumo negative -  Legionella negative -  BCx NGTD -  Continue zofran and PPI  Dementia  -  D/c tele sitter - continue namenda  Deconditioning and falls, including fall with bone bruising here in hospital -  Falls precautions -  PT/OT evaluation recommending SNF -  Repeat XR of chest and spine negative for acute fracture -  Family requesting [facility] at discharge  5-Osteoarthritis of both knees, stable, continue prn tylenol  Sleep apnea, not on CPAP -  Needs outpatient sleep study  Hyperglycemia, due to stress, A1c 5.5  Dyslipidemia, stable, continue fatty acids   DVT prophylaxis: heparin  Code Status:  Full Family Communication:  patient alone Disposition Plan: anticipate to SNF tomorrow  Consultants:   none  Procedures:  none  Antimicrobials:  Anti-infectives    Start     Dose/Rate Route Frequency Ordered Stop   08/24/16 1000  azithromycin (ZITHROMAX) tablet 500 mg     500 mg Oral Daily 08/23/16 0928 08/29/16 0959   08/23/16 1200  cefTRIAXone (ROCEPHIN) 1 g in dextrose 5 % 50 mL IVPB     1 g 100 mL/hr over 30 Minutes Intravenous Every 24 hours 08/22/16 2033 08/30/16 1159   08/23/16 1200  azithromycin (ZITHROMAX) 500 mg in dextrose 5 % 250 mL IVPB  Status:  Discontinued     500 mg 250 mL/hr over 60 Minutes Intravenous Every 24 hours 08/22/16 2033 08/23/16 0928   08/23/16 1200  azithromycin (ZITHROMAX) 500 mg in dextrose 5 % 250 mL IVPB     500 mg 250 mL/hr over 60 Minutes Intravenous  Once 08/23/16 0928 08/23/16 1312   08/22/16 1130  oseltamivir (TAMIFLU) capsule 75 mg     75 mg Oral  Once 08/22/16 1124 08/22/16 1302   08/22/16 1115  cefTRIAXone (ROCEPHIN) 1 g in dextrose 5 % 50 mL IVPB     1 g 100 mL/hr over 30 Minutes Intravenous  Once 08/22/16 1111 08/22/16 1303   08/22/16 1115  azithromycin (ZITHROMAX) 500 mg in dextrose 5 % 250 mL IVPB     500 mg 250 mL/hr over 60 Minutes Intravenous  Once 08/22/16 1111 08/22/16 1447       Subjective: Still having moderate pain over left flank where  he states he hit his back on a table worse with movement, but better able to take a deep breath with inspiration.  Denies nausea, vomiting.    Objective: Vitals:   08/24/16 2222 08/25/16 0417 08/25/16 0922 08/25/16 1459  BP: (!) 143/73 134/90  125/77  Pulse: 85 80 75 74  Resp: 17 18 16 18   Temp: 98.4 F (36.9 C) 98.2 F (36.8 C)  97.6 F (36.4 C)  TempSrc: Oral Oral  Oral  SpO2: 92% 93% 93% 94%  Weight:      Height:        Intake/Output Summary (Last 24 hours) at 08/25/16 1807 Last data filed at 08/25/16 1500  Gross per 24 hour  Intake           2131.5 ml  Output               650 ml  Net           1481.5 ml   Filed Weights   08/22/16 0759  Weight: 83.9 kg (185 lb)    Examination:  General exam:  Adult male.  No acute distress.  HEENT:  NCAT, MMM   Respiratory system:  CTAB Cardiovascular system: Regular rate and rhythm, normal S1/S2. No murmurs, rubs, gallops or clicks.  Warm extremities Gastrointestinal system: Proactive bowel sounds, soft, nondistended, nontender. Chest:  Pain with palpation of the left flank, no obvious superficial bruising MSK:  Normal tone and bulk, no lower extremity edema Neuro:  5/5 RLE and 5-/5 LLE strength, sensation intact to light touch bilateral lower extremities    Data Reviewed: I have personally reviewed following labs and imaging studies  CBC:  Recent Labs Lab 08/22/16 0833 08/22/16 2127 08/23/16 0623  WBC 13.0* 8.7 6.8  NEUTROABS 11.5*  --   --   HGB 13.6 12.6* 11.6*  HCT 40.5 39.0 35.8*  MCV 89.0 88.4 88.4  PLT 238 244 228   Basic Metabolic Panel:  Recent Labs Lab 08/22/16 0833 08/22/16 2127 08/23/16 0623 08/25/16 0619  NA 138  --  137 136  K 3.9  --  3.5 3.7  CL 108  --  105 103  CO2 23  --  27 27  GLUCOSE 160*  --  110* 113*  BUN 19  --  15 9  CREATININE 0.56* 0.77 0.65 0.68  CALCIUM 8.1*  --  7.9* 8.3*  MG  --  2.1  --   --   PHOS  --  2.6  --   --    GFR: Estimated Creatinine Clearance: 71.4 mL/min (by C-G formula based on SCr of 0.68 mg/dL). Liver Function Tests:  Recent Labs Lab 08/22/16 0833  AST 18  ALT 16*  ALKPHOS 112  BILITOT 1.1  PROT 6.2*  ALBUMIN 3.0*    Recent Labs Lab 08/22/16 0833  LIPASE 16   No results for input(s): AMMONIA in the last 168 hours. Coagulation Profile: No results for input(s): INR, PROTIME in the last 168 hours. Cardiac Enzymes: No results for input(s): CKTOTAL, CKMB, CKMBINDEX, TROPONINI in the last 168 hours. BNP (last 3 results) No results for input(s): PROBNP in the last 8760 hours. HbA1C:  Recent Labs  08/22/16 2127  HGBA1C  5.5   CBG: No results for input(s): GLUCAP in the last 168 hours. Lipid Profile: No results for input(s): CHOL, HDL, LDLCALC, TRIG, CHOLHDL, LDLDIRECT in the last 72 hours. Thyroid Function Tests: No results for input(s): TSH, T4TOTAL, FREET4, T3FREE, THYROIDAB in the last 72 hours.  Anemia Panel: No results for input(s): VITAMINB12, FOLATE, FERRITIN, TIBC, IRON, RETICCTPCT in the last 72 hours. Urine analysis:    Component Value Date/Time   COLORURINE YELLOW 08/22/2016 1044   APPEARANCEUR CLEAR 08/22/2016 1044   LABSPEC 1.023 08/22/2016 1044   PHURINE 5.0 08/22/2016 1044   GLUCOSEU NEGATIVE 08/22/2016 1044   HGBUR NEGATIVE 08/22/2016 1044   BILIRUBINUR NEGATIVE 08/22/2016 1044   KETONESUR NEGATIVE 08/22/2016 1044   PROTEINUR NEGATIVE 08/22/2016 1044   UROBILINOGEN 1.0 08/25/2013 1228   NITRITE NEGATIVE 08/22/2016 1044   LEUKOCYTESUR NEGATIVE 08/22/2016 1044   Sepsis Labs: @LABRCNTIP (procalcitonin:4,lacticidven:4)  ) Recent Results (from the past 240 hour(s))  Culture, blood (routine x 2) Call MD if unable to obtain prior to antibiotics being given     Status: None (Preliminary result)   Collection Time: 08/22/16  9:27 PM  Result Value Ref Range Status   Specimen Description BLOOD LEFT ANTECUBITAL  Final   Special Requests BOTTLES DRAWN AEROBIC AND ANAEROBIC 10CC EACH  Final   Culture   Final    NO GROWTH 2 DAYS Performed at [facility]    Report Status PENDING  Incomplete  Culture, blood (routine x 2) Call MD if unable to obtain prior to antibiotics being given     Status: None (Preliminary result)   Collection Time: 08/22/16  9:30 PM  Result Value Ref Range Status   Specimen Description BLOOD RIGHT HAND  Final   Special Requests BOTTLES DRAWN AEROBIC ONLY 8CC  Final   Culture   Final    NO GROWTH 2 DAYS Performed at [facility]    Report Status PENDING  Incomplete  Urine culture     Status: None   Collection Time: 08/22/16  9:35 PM  Result Value  Ref Range Status   Specimen Description URINE, RANDOM  Final   Special Requests NONE  Final   Culture NO GROWTH Performed at [facility]   Final   Report Status 08/24/2016 FINAL  Final      Radiology Studies: No results found.   Scheduled Meds: . acetaminophen  1,000 mg Oral Q8H  . azithromycin  500 mg Oral Daily  . budesonide (PULMICORT) nebulizer solution  0.25 mg Nebulization BID  . cefTRIAXone (ROCEPHIN)  IV  1 g Intravenous Q24H  . feeding supplement (ENSURE ENLIVE)  237 mL Oral BID BM  . guaiFENesin  600 mg Oral BID  . heparin  5,000 Units Subcutaneous Q8H  . Influenza vac split quadrivalent PF  0.5 mL Intramuscular Tomorrow-1000  . lidocaine  1 patch Transdermal Q24H  . loratadine  10 mg Oral Daily  . memantine  10 mg Oral Daily  . omega-3 acid ethyl esters  1 g Oral Daily  . pantoprazole  40 mg Oral Q1200   Continuous Infusions:   LOS: 3 days    Time spent: 30 min    Renae Fickle, MD [facility] Pager [phone]  If 7PM-7AM, please contact night-coverage [email or web address] Password [facility] 08/25/2016, 6:07 PM

## 2016-08-25 NOTE — NC FL2 (Signed)
[place] MEDICAID FL2 LEVEL OF CARE SCREENING TOOL     IDENTIFICATION  Patient Name: Nicholas Herring Birthdate: 1927-05-30 Sex: male Admission Date (Current Location): 08/22/2016  [facility] and [place] Number:  Producer, television/film/video and Address:  [facility],  [address] [place], [place] [address]      Provider Number: [id number]  Attending Physician Name and Address:  Renae Fickle, MD  Relative Name and Phone Number:       Current Level of Care: Hospital Recommended Level of Care: Skilled Nursing Facility Prior Approval Number:    Date Approved/Denied:   PASRR Number: [id number]  Discharge Plan: SNF    Current Diagnoses: Patient Active Problem List   Diagnosis Date Noted  . Influenza-like illness 08/22/2016  . Sleep apnea 08/22/2016  . Nausea & vomiting 08/22/2016  . CAP (community acquired pneumonia) 08/22/2016  . Acute respiratory failure with hypoxia (HCC) 08/22/2016  . Hyperglycemia 08/22/2016  . Acute esophagitis 08/27/2013  . Melena 08/26/2013  . Hematemesis 08/25/2013  . Ileus, postoperative (HCC) 08/25/2013  . Hiatal hernia 08/17/2013  . Osteoarthritis of both knees 08/17/2013  . Recurrent bilateral inguinal hernia (BIH) s/p lap repair w mesh 08/23/2013 08/17/2013  . Difficulty in urination     Orientation RESPIRATION BLADDER Height & Weight     Self, Place  Normal External catheter Weight: 185 lb (83.9 kg) Height:  6' (182.9 cm)  BEHAVIORAL SYMPTOMS/MOOD NEUROLOGICAL BOWEL NUTRITION STATUS      Incontinent  (Heart Healthy)  AMBULATORY STATUS COMMUNICATION OF NEEDS Skin   Extensive Assist Verbally Normal                       Personal Care Assistance Level of Assistance  Bathing, Feeding, Dressing Bathing Assistance: Limited assistance Feeding assistance: Independent Dressing Assistance: Limited assistance     Functional Limitations Info  Sight, Hearing, Speech Sight Info: Adequate Hearing Info: Adequate Speech Info:  Adequate    SPECIAL CARE FACTORS FREQUENCY  PT (By licensed PT), OT (By licensed OT)     PT Frequency: 5 OT Frequency: 5            Contractures Contractures Info: Not present    Additional Factors Info  Code Status, Allergies Code Status Info: Fullcode Allergies Info: Nsaids, Codeine, Morphine And Related           Current Medications (08/25/2016):  This is the current hospital active medication list Current Facility-Administered Medications  Medication Dose Route Frequency Provider Last Rate Last Dose  . acetaminophen (TYLENOL) tablet 1,000 mg  1,000 mg Oral Q8H Renae Fickle, MD   1,000 mg at 08/25/16 0630  . azithromycin (ZITHROMAX) tablet 500 mg  500 mg Oral Daily Phylliss Blakes, RPH   500 mg at 08/25/16 0955  . budesonide (PULMICORT) nebulizer solution 0.25 mg  0.25 mg Nebulization BID Vassie Loll, MD   0.25 mg at 08/25/16 4401  . cefTRIAXone (ROCEPHIN) 1 g in dextrose 5 % 50 mL IVPB  1 g Intravenous Q24H Vassie Loll, MD   1 g at 08/24/16 1142  . feeding supplement (ENSURE ENLIVE) (ENSURE ENLIVE) liquid 237 mL  237 mL Oral BID BM Renae Fickle, MD   237 mL at 08/25/16 0954  . guaiFENesin (MUCINEX) 12 hr tablet 600 mg  600 mg Oral BID Vassie Loll, MD   600 mg at 08/25/16 0955  . heparin injection 5,000 Units  5,000 Units Subcutaneous Q8H Vassie Loll, MD   5,000 Units at 08/25/16 [phone]  .  Influenza vac split quadrivalent PF (FLUARIX) injection 0.5 mL  0.5 mL Intramuscular Tomorrow-1000 Vassie Lollarlos Madera, MD      . ipratropium-albuterol (DUONEB) 0.5-2.5 (3) MG/3ML nebulizer solution 3 mL  3 mL Nebulization Q6H PRN Vassie Lollarlos Madera, MD      . lidocaine (LIDODERM) 5 % 1 patch  1 patch Transdermal Q24H Renae FickleMackenzie Short, MD   1 patch at 08/24/16 1018  . loratadine (CLARITIN) tablet 10 mg  10 mg Oral Daily Vassie Lollarlos Madera, MD   10 mg at 08/25/16 0955  . memantine (NAMENDA) tablet 10 mg  10 mg Oral Daily Vassie Lollarlos Madera, MD   10 mg at 08/25/16 0955  . omega-3 acid ethyl esters  (LOVAZA) capsule 1 g  1 g Oral Daily Vassie Lollarlos Madera, MD   1 g at 08/25/16 0955  . ondansetron (ZOFRAN) injection 4 mg  4 mg Intravenous Q6H PRN Vassie Lollarlos Madera, MD   4 mg at 08/24/16 0930  . pantoprazole (PROTONIX) EC tablet 40 mg  40 mg Oral Q1200 Vassie Lollarlos Madera, MD   40 mg at 08/24/16 1316     Discharge Medications: Please see discharge summary for a list of discharge medications.  Relevant Imaging Results:  Relevant Lab Results:   Additional Information ssn:[id number]  Clearance CootsNicole A Pariss Hommes, LCSW

## 2016-08-26 LAB — BASIC METABOLIC PANEL
Anion gap: 9 (ref 5–15)
BUN: 9 mg/dL (ref 6–20)
CO2: 25 mmol/L (ref 22–32)
CREATININE: 0.61 mg/dL (ref 0.61–1.24)
Calcium: 8.6 mg/dL — ABNORMAL LOW (ref 8.9–10.3)
Chloride: 103 mmol/L (ref 101–111)
Glucose, Bld: 96 mg/dL (ref 65–99)
POTASSIUM: 3.8 mmol/L (ref 3.5–5.1)
SODIUM: 137 mmol/L (ref 135–145)

## 2016-08-26 MED ORDER — LIDOCAINE 4 % EX PTCH: 1.0000 | MEDICATED_PATCH | Freq: Every day | CUTANEOUS | 0 refills | Status: AC

## 2016-08-26 MED ORDER — CETIRIZINE HCL 10 MG PO TABS: 10.0000 mg | ORAL_TABLET | Freq: Every evening | ORAL | 0 refills | Status: AC | PRN

## 2016-08-26 MED ORDER — ENSURE ENLIVE PO LIQD: 237.0000 mL | Freq: Two times a day (BID) | ORAL | 12 refills | Status: DC

## 2016-08-26 NOTE — Clinical Social Work Placement (Signed)
[facility] has a [facility]. CSW has completed FL2 & will continue to follow and assist with discharge when ready.    [name], LCSW [facility] Clinical Social Worker cell #: [phone]     CLINICAL SOCIAL WORK PLACEMENT  NOTE  Date:  08/26/2016  Patient Details  Name: Nicholas Herring MRN: [id number] Date of Birth: 03/04/1927  Clinical Social Work is seeking post-discharge placement for this patient at the Skilled  Nursing Facility level of care (*CSW will initial, date and re-position this form in  chart as items are completed):  Yes   Patient/family provided with [facility] Clinical Social Work Department's list of facilities offering this level of care within the geographic area requested by the patient (or if unable, by the patient's family).  Yes   Patient/family informed of their freedom to choose among providers that offer the needed level of care, that participate in Medicare, Medicaid or managed care program needed by the patient, have an available bed and are willing to accept the patient.  Yes   Patient/family informed of [facility]'s ownership interest in [facility] and [facility], as well as of the fact that they are under no obligation to receive care at these facilities.  PASRR submitted to EDS on 08/26/16     PASRR number received on 08/26/16     Existing PASRR number confirmed on       FL2 transmitted to all facilities in geographic area requested by pt/family on 08/26/16     FL2 transmitted to all facilities within larger geographic area on       Patient informed that his/her managed care company has contracts with or will negotiate with certain facilities, including the following:        Yes   Patient/family informed of bed offers received.  [facility]     Physician recommends and patient chooses bed at      [facility] on   .  Patient to be transferred to facility by       Patient family notified on   of transfer.  Name of family member notified:        PHYSICIAN       Additional Comment:    _______________________________________________ [name], [name], LCSW 08/26/2016, 1:47 PM

## 2016-08-26 NOTE — Discharge Summary (Signed)
Physician Discharge Summary  IRL Nicholas Herring:[id number] DOB: [birth year] DOA: 08/22/2016  PCP: Tally Due, MD  Admit date: 08/22/2016 Discharge date: 08/26/2016  Admitted From: home  Disposition:  SNF  Recommendations for Outpatient Follow-up:  1. Please obtain BMP/CBC in one week 2. Please follow up on the following pending results:  Blood cultures 3. Outpatient sleep study please  Home Health:  PT/OT at SNF  Equipment/Devices:  None  Discharge Condition:  Stable, improved CODE STATUS:  Full code  Diet recommendation:  Healthy heart  Brief/Interim Summary:  Nicholas Herring a 83 y.o.malewith PMH significant for dementia, sleep apnea, GERD and hx of incarcerated hernia (surgically repaired in 2009), with subsequent SBO requiring laparoscopic lysis of adhesions; who presented to ED with SOB, productive cough, subjective fevers and chills, generalized malaise and nausea/vomiting. CXR demonstrated lower lobes infiltrates; patient also with hypoxia on RA. Started on oxygen supplementation.  Flu PCR was negative.  He improved with antibiotics for community-acquired pneumonia. His oral intake improved and his diarrhea resolved.  He had an unwitnessed fall in the emergency department that resulted in left flank pain.  Repeat XR of the ribs and thoracic spine demonstrated no evidence of fracture, so he likely has some bone bruising.   he work with physical and occupational therapy who recommended ongoing therapies at skilled nursing facility.  Discharge Diagnoses:  Principal Problem:   Acute respiratory failure with hypoxia (HCC) Active Problems:   Hiatal hernia   Osteoarthritis of both knees   Influenza-like illness   Sleep apnea   Nausea & vomiting   CAP (community acquired pneumonia)   Hyperglycemia  Acute respiratory failure with hypoxia (HCC), nausea, and vomiting, likely due to LLL CAP.  Improved oral intake and stable on room air for over 24 hours. -  Completed 5  days of azithromycin and ceftriaxone -  Flu negative -  S. pneumo negative -  Legionella negative -  BCx NGTD  Dementia, stable - continue namenda  Deconditioning and falls, including fall with bone bruising here in hospital -  Falls precautions -  PT/OT recommended SNF -  Repeat XR of chest and spine negative for acute fracture -  Family requesting SNF at discharge  Osteoarthritis of both knees, stable, continue prn tylenol  Sleep apnea, not on CPAP -  Needs outpatient sleep study  Hyperglycemia, due to stress, A1c 5.5  Dyslipidemia, stable, continue fatty acids   Discharge Instructions  Discharge Instructions    Diet - low sodium heart healthy    Complete by:  As directed    Increase activity slowly    Complete by:  As directed        Medication List    STOP taking these medications   diphenhydrAMINE 25 MG tablet Commonly known as:  BENADRYL     TAKE these medications   cetirizine 10 MG tablet Commonly known as:  ZYRTEC Take 1 tablet (10 mg total) by mouth at bedtime as needed for allergies. What changed:  when to take this  reasons to take this   feeding supplement (ENSURE ENLIVE) Liqd Take 237 mLs by mouth 2 (two) times daily between meals. Start taking on:  08/27/2016   Fish Oil 1200 MG Caps Take 1,200 mg by mouth every evening.   Lidocaine 4 % Ptch Apply 1 patch topically daily.   memantine 10 MG tablet Commonly known as:  NAMENDA Take 10 mg by mouth daily.   triamcinolone cream 0.1 % Commonly known as:  KENALOG Apply  1 application topically 2 (two) times daily as needed for irritation.   Vitamin D-3 1000 units Caps Take 1,000 Units by mouth every evening.      Contact information for after-discharge care    Destination    [facility] Follow up.   Specialty:  Skilled Nursing Facility Contact information: [address] [place] [address] [phone]             Allergies  Allergen  Reactions  . Nsaids Other (See Comments)    Ulceration   . Codeine Other (See Comments)    Unknown reaction  . Morphine And Related Nausea And Vomiting    Consultations: None   Procedures/Studies: Dg Chest 2 View  Result Date: 08/23/2016 CLINICAL DATA:  Per RN, pt had fell yesterday when trying to get out of ED stretcher, found on floor by nurse tech, pt c/o severe LEFT sided back/rib pain. Admitted for worsening dyspnea, cough, and ams. EXAM: CHEST - 2 VIEW COMPARISON:  08/22/2016 FINDINGS: Small bilateral pleural effusions with adjacent atelectasis/ infiltrate posteriorly in both lower lobes.Improved aeration at the left lung base. Patchy airspace opacities in the right mid upper lung persist. Heart size normal.  Tortuous thoracic aorta. No pneumothorax.  Surgical clips in the upper abdomen. Spurring in the lower thoracic spine. IMPRESSION: 1. Small bilateral pleural effusions. 2. Improved left lower lung aeration with persistent right upper and mid lung airspace opacities. Electronically Signed   By: Corlis Leak M.D.   On: 08/23/2016 10:50   Dg Chest 2 View  Result Date: 08/22/2016 CLINICAL DATA:  New cough, nausea, vomiting, mid abdominal pain. EXAM: CHEST  2 VIEW COMPARISON:  08/29/2013 FINDINGS: Mild cardiomegaly. Low lung volumes. Bibasilar airspace opacities. No effusions or acute bony abnormality. IMPRESSION: Bibasilar atelectasis or infiltrates.  Low lung volumes. Electronically Signed   By: Charlett Nose M.D.   On: 08/22/2016 09:29   Dg Thoracic Spine 2 View  Result Date: 08/23/2016 CLINICAL DATA:  Per RN, pt had fell yesterday when trying to get out of ED stretcher, found on floor by nurse tech, pt c/o severe LEFT sided back/rib pain. Admitted for worsening dyspnea, cough, and ams. EXAM: THORACIC SPINE 2 VIEWS COMPARISON:  08/22/2016 FINDINGS: Mild thoracic dextroscoliosis. No definite fracture. Anterior vertebral endplate spurring at multiple levels in the mid and lower thoracic  spine. Spondylitic changes in the visualized lower cervical spine. Surgical clips and sutures in the visualized mid abdomen. IMPRESSION: 1. Negative for fracture or other acute bone abnormality. Electronically Signed   By: Corlis Leak M.D.   On: 08/23/2016 10:51   Dg Abd 2 Views  Result Date: 08/22/2016 CLINICAL DATA:  Cough, abdominal pain, nausea, vomiting. EXAM: ABDOMEN - 2 VIEW COMPARISON:  Radiographs of August 29, 2013. FINDINGS: The bowel gas pattern is normal. There is no evidence of free air. Status post cholecystectomy. Status post anterior abdominal wall hernia repair. No radio-opaque calculi or other significant radiographic abnormality is seen. IMPRESSION: No evidence of bowel obstruction or ileus. Electronically Signed   By: Lupita Raider, M.D.   On: 08/22/2016 09:32    Subjective:  Denies shortness of breath, cough, chest pains, abdominal pain. He is continuing to have the left flank pain that he is described for the last few days. Denies nausea and ate well yesterday.  Discharge Exam: Vitals:   08/26/16 0552 08/26/16 0737  BP: 137/76   Pulse: 80 78  Resp: 18 17  Temp: 97.9 F (36.6 C)  Vitals:   08/25/16 2120 08/25/16 2145 08/26/16 0552 08/26/16 0737  BP: 129/65  137/76   Pulse: 77  80 78  Resp:   18 17  Temp: 98 F (36.7 C)  97.9 F (36.6 C)   TempSrc: Oral  Oral   SpO2: 93% 93% 94% 94%  Weight:      Height:        General exam:  Adult male.  No acute distress.  HEENT:  NCAT, MMM   Respiratory system:  CTAB Cardiovascular system: Regular rate and rhythm, normal S1/S2. No murmurs, rubs, gallops or clicks.  Warm extremities Gastrointestinal system: Normal active bowel sounds, soft, nondistended, nontender. Chest:  Pain with palpation of the left flank, no obvious superficial bruising MSK:  Normal tone and bulk, no lower extremity edema    The results of significant diagnostics from this hospitalization (including imaging, microbiology, ancillary and  laboratory) are listed below for reference.     Microbiology: Recent Results (from the past 240 hour(s))  Culture, blood (routine x 2) Call MD if unable to obtain prior to antibiotics being given     Status: None (Preliminary result)   Collection Time: 08/22/16  9:27 PM  Result Value Ref Range Status   Specimen Description BLOOD LEFT ANTECUBITAL  Final   Special Requests BOTTLES DRAWN AEROBIC AND ANAEROBIC 10CC EACH  Final   Culture   Final    NO GROWTH 2 DAYS Performed [facility]    Report Status PENDING  Incomplete  Culture, blood (routine x 2) Call MD if unable to obtain prior to antibiotics being given     Status: None (Preliminary result)   Collection Time: 08/22/16  9:30 PM  Result Value Ref Range Status   Specimen Description BLOOD RIGHT HAND  Final   Special Requests BOTTLES DRAWN AEROBIC ONLY 8CC  Final   Culture   Final    NO GROWTH 2 DAYS [facility]formed [facility]    Report Status PENDING  Incomplete  Urine culture     Status: None   Collection Time: 08/22/16  9:35 PM  Result Value Ref Range Status   Specimen Description URINE, RANDOM  Final   Special Requests NONE  Final   Culture NO GROWTH Performed at [facility]   Final   Report Status 08/24/2016 FINAL  Final     Labs: BNP (last 3 results) No results for input(s): BNP in the last 8760 hours. Basic Metabolic Panel:  Recent Labs Lab 08/22/16 0833 08/22/16 2127 08/23/16 0623 08/25/16 0619 08/26/16 0629  NA 138  --  137 136 137  K 3.9  --  3.5 3.7 3.8  CL 108  --  105 103 103  CO2 23  --  27 27 25   GLUCOSE 160*  --  110* 113* 96  BUN 19  --  15 9 9   CREATININE 0.56* 0.77 0.65 0.68 0.61  CALCIUM 8.1*  --  7.9* 8.3* 8.6*  MG  --  2.1  --   --   --   PHOS  --  2.6  --   --   --    Liver Function Tests:  Recent Labs Lab 08/22/16 0833  AST 18  ALT 16*  ALKPHOS 112  BILITOT 1.1  PROT 6.2*  ALBUMIN 3.0*    Recent Labs Lab 08/22/16 0833  LIPASE 16   No results  for input(s): AMMONIA in the last 168 hours. CBC:  Recent Labs Lab 08/22/16 0833 08/22/16  2127 08/23/16 0623  WBC 13.0* 8.7 6.8  NEUTROABS 11.5*  --   --   HGB 13.6 12.6* 11.6*  HCT 40.5 39.0 35.8*  MCV 89.0 88.4 88.4  PLT 238 244 228   Cardiac Enzymes: No results for input(s): CKTOTAL, CKMB, CKMBINDEX, TROPONINI in the last 168 hours. BNP: Invalid input(s): POCBNP CBG: No results for input(s): GLUCAP in the last 168 hours. D-Dimer No results for input(s): DDIMER in the last 72 hours. Hgb A1c No results for input(s): HGBA1C in the last 72 hours. Lipid Profile No results for input(s): CHOL, HDL, LDLCALC, TRIG, CHOLHDL, LDLDIRECT in the last 72 hours. Thyroid function studies No results for input(s): TSH, T4TOTAL, T3FREE, THYROIDAB in the last 72 hours.  Invalid input(s): FREET3 Anemia work up No results for input(s): VITAMINB12, FOLATE, FERRITIN, TIBC, IRON, RETICCTPCT in the last 72 hours. Urinalysis    Component Value Date/Time   COLORURINE YELLOW 08/22/2016 1044   APPEARANCEUR CLEAR 08/22/2016 1044   LABSPEC 1.023 08/22/2016 1044   PHURINE 5.0 08/22/2016 1044   GLUCOSEU NEGATIVE 08/22/2016 1044   HGBUR NEGATIVE 08/22/2016 1044   BILIRUBINUR NEGATIVE 08/22/2016 1044   KETONESUR NEGATIVE 08/22/2016 1044   PROTEINUR NEGATIVE 08/22/2016 1044   UROBILINOGEN 1.0 08/25/2013 1228   NITRITE NEGATIVE 08/22/2016 1044   LEUKOCYTESUR NEGATIVE 08/22/2016 1044   Sepsis Labs Invalid input(s): PROCALCITONIN,  WBC,  LACTICIDVEN   Time coordinating discharge: Over 30 minutes  SIGNED:   Renae Fickle, MD  [facility] 08/26/2016, 2:39 PM Pager   If 7PM-7AM, please contact night-coverage [email or web address] Password [email or web address]

## 2016-08-26 NOTE — Progress Notes (Signed)
Physical Therapy Treatment Patient Details Name: Nicholas PassyJackson B Muramoto MRN: [id number] DOB: 01/10/1927 Today's Date: 08/26/2016    History of Present Illness Pt admit with acute respiratory failure with hypoxia. H/O dementia    PT Comments    Assisted OOB to amb a limited distance required + 2 assist for safety and son following with recliner.   [facility]   Follow Up Recommendations  SNF     Equipment Recommendations  None recommended by PT    Recommendations for Other Services       Precautions / Restrictions Precautions Precautions: Fall Precaution Comments: fell here at the hospital as well, stated he hit his left side bottom rib area, near his large hernia area.  Restrictions Weight Bearing Restrictions: No Other Position/Activity Restrictions: has a "bad" L knee "bone on bone"    Mobility  Bed Mobility Overal bed mobility: Needs Assistance Bed Mobility: Supine to Sit     Supine to sit: Mod assist     General bed mobility comments: assist for hips and trunk plus required increased time esp to complete scooting to EOB  Transfers Overall transfer level: Needs assistance Equipment used: Rolling walker (2 wheeled) Transfers: Sit to/from Stand Sit to Stand: Mod assist;+2 physical assistance;+2 safety/equipment         General transfer comment: posterior lean, cues for UE, LE under him and leaning forward to stand.  Assist to rise and stabilize  Ambulation/Gait Ambulation/Gait assistance: Min assist;Mod assist;+2 safety/equipment;+2 physical assistance Ambulation Distance (Feet): 46 Feet Assistive device: Rolling walker (2 wheeled) Gait Pattern/deviations: Step-to pattern;Step-through pattern;Decreased stance time - left Gait velocity: decreased   General Gait Details: limited amb distance due to fatigue and unsteady gait with c/o L knee pain "bone on bone".  Son assisted by following with recliner.  Poor posture with flex hips and knees.      Stairs            Wheelchair Mobility    Modified Rankin (Stroke Patients Only)       Balance                                    Cognition Arousal/Alertness: Awake/alert Behavior During Therapy: WFL for tasks assessed/performed Overall Cognitive Status: Within Functional Limits for tasks assessed                 General Comments: followed all commands/pleasant    Exercises      General Comments        Pertinent Vitals/Pain Pain Assessment: Faces Faces Pain Scale: Hurts a little bit Pain Location: L flank Pain Descriptors / Indicators: Discomfort;Grimacing Pain Intervention(s): Monitored during session    Home Living                      Prior Function            PT Goals (current goals can now be found in the care plan section) Progress towards PT goals: Progressing toward goals    Frequency    Min 3X/week      PT Plan Current plan remains appropriate    Co-evaluation             End of Session Equipment Utilized During Treatment: Gait belt Activity Tolerance: Patient limited by fatigue Patient left: in chair;with chair alarm set;with family/visitor present     Time: 0935-1000 PT Time Calculation (  min) (ACUTE ONLY): 25 min  Charges:  $Gait Training: 8-22 mins $Therapeutic Activity: 8-22 mins                    G Codes:      Rica Koyanagi  PTA [facility]  Acute  Rehab Pager      [phone]

## 2016-08-26 NOTE — Care Management Note (Signed)
Case Management Note  Patient Details  Name: Lucille PassyJackson B Farrey MRN: [id number] Date of Birth: 09/15/1926  Subjective/Objective:                  SOB, productive cough, N/V and generalized weakness. Action/Plan: Discharge planning Expected Discharge Date:   (unknown)               Expected Discharge Plan:  Skilled Nursing Facility  In-House Referral:     Discharge planning Services  CM Consult  Post Acute Care Choice:    Choice offered to:  NA  DME Arranged:  N/A DME Agency:  NA  HH Arranged:  NA HH Agency:  NA  Status of Service:  Completed, signed off  If discussed at Long Length of Stay Meetings, dates discussed:    Additional Comments: CM notes pt to go to SNF upon discharge; CSW aware and arranging. No other CM needs were communicated. Yves DillJeffries, Jermario Kalmar Christine, RN 08/26/2016, 1:35 PM

## 2016-08-26 NOTE — Progress Notes (Signed)
[facility] called for transport.  

## 2016-08-27 NOTE — Progress Notes (Signed)
[place] at facillity.

## 2016-08-28 LAB — CULTURE, BLOOD (ROUTINE X 2)
CULTURE: NO GROWTH
Culture: NO GROWTH

## 2017-03-28 ENCOUNTER — Encounter (HOSPITAL_COMMUNITY): Payer: Self-pay | Admitting: Emergency Medicine

## 2017-03-28 ENCOUNTER — Inpatient Hospital Stay (HOSPITAL_COMMUNITY)
Admission: EM | Admit: 2017-03-28 | Discharge: 2017-03-31 | DRG: 872 | Disposition: A | Discharge status: Home / Self Care | Payer: Medicare Other | Attending: Internal Medicine | Admitting: Internal Medicine

## 2017-03-28 DIAGNOSIS — Z87891 Personal history of nicotine dependence: Secondary | ICD-10-CM

## 2017-03-28 DIAGNOSIS — A419 Sepsis, unspecified organism: Secondary | ICD-10-CM | POA: Diagnosis not present

## 2017-03-28 DIAGNOSIS — R6520 Severe sepsis without septic shock: Secondary | ICD-10-CM | POA: Diagnosis present

## 2017-03-28 DIAGNOSIS — Z882 Allergy status to sulfonamides status: Secondary | ICD-10-CM

## 2017-03-28 DIAGNOSIS — N390 Urinary tract infection, site not specified: Secondary | ICD-10-CM | POA: Diagnosis present

## 2017-03-28 DIAGNOSIS — Z885 Allergy status to narcotic agent status: Secondary | ICD-10-CM

## 2017-03-28 DIAGNOSIS — K219 Gastro-esophageal reflux disease without esophagitis: Secondary | ICD-10-CM | POA: Diagnosis present

## 2017-03-28 DIAGNOSIS — G4733 Obstructive sleep apnea (adult) (pediatric): Secondary | ICD-10-CM | POA: Diagnosis present

## 2017-03-28 DIAGNOSIS — K439 Ventral hernia without obstruction or gangrene: Secondary | ICD-10-CM | POA: Diagnosis present

## 2017-03-28 DIAGNOSIS — F05 Delirium due to known physiological condition: Secondary | ICD-10-CM | POA: Diagnosis present

## 2017-03-28 DIAGNOSIS — E872 Acidosis: Secondary | ICD-10-CM | POA: Diagnosis present

## 2017-03-28 DIAGNOSIS — H3530 Unspecified macular degeneration: Secondary | ICD-10-CM | POA: Diagnosis present

## 2017-03-28 DIAGNOSIS — R739 Hyperglycemia, unspecified: Secondary | ICD-10-CM | POA: Diagnosis present

## 2017-03-28 DIAGNOSIS — R112 Nausea with vomiting, unspecified: Secondary | ICD-10-CM | POA: Diagnosis present

## 2017-03-28 DIAGNOSIS — G4730 Sleep apnea, unspecified: Secondary | ICD-10-CM | POA: Diagnosis present

## 2017-03-28 DIAGNOSIS — E860 Dehydration: Secondary | ICD-10-CM | POA: Diagnosis present

## 2017-03-28 DIAGNOSIS — F0390 Unspecified dementia without behavioral disturbance: Secondary | ICD-10-CM | POA: Diagnosis present

## 2017-03-28 DIAGNOSIS — M170 Bilateral primary osteoarthritis of knee: Secondary | ICD-10-CM | POA: Diagnosis present

## 2017-03-28 DIAGNOSIS — K5730 Diverticulosis of large intestine without perforation or abscess without bleeding: Secondary | ICD-10-CM | POA: Diagnosis present

## 2017-03-28 DIAGNOSIS — N179 Acute kidney failure, unspecified: Secondary | ICD-10-CM | POA: Diagnosis present

## 2017-03-28 DIAGNOSIS — N400 Enlarged prostate without lower urinary tract symptoms: Secondary | ICD-10-CM | POA: Diagnosis present

## 2017-03-28 MED ORDER — SODIUM CHLORIDE 0.9 % IV BOLUS (SEPSIS)
1000.0000 mL | Freq: Once | INTRAVENOUS | Status: AC
  Administered 2017-03-29: 1000 mL via INTRAVENOUS

## 2017-03-28 NOTE — ED Notes (Signed)
Bed: [facility]94 Expected date:  Expected time:  Means of arrival:  Comments: 89 m hypotension

## 2017-03-28 NOTE — ED Triage Notes (Signed)
Per EMS pt. From [facility] SNF with complaint of vomiting x 4 today, no report of diarrhea nor fever. Pt. Has dementia , no complaint of pain reported. Pt. Given Phenergan 12.5 IM x 2 doses  At facilityy without relief. Rectal checked for constipation done at facility, negative . Unknown last bowel movement

## 2017-03-28 NOTE — ED Provider Notes (Signed)
[facility]-EMERGENCY DEPT Provider Note   CSN: [id number] Arrival date & time: 03/28/17  2329    History   Chief Complaint Chief Complaint  Patient presents with  . Emesis  . Hypotension    LEVEL 5 CAVEAT 2/2 DEMENTIA  HPI Nicholas Herring is a 83 y.o. male.  83 year old male with a history of dementia, sleep apnea, GERD and hx of incarcerated hernia (surgically repaired in 2009), with subsequent SBO requiring laparoscopic lysis of adhesions, and acute urinary retention presents to the emergency department from [facility] skilled nursing facility. EMS reports vomiting 4 today. Patient denies having a bowel movement today. He has not had any documented fever at his facility. Patient, during my assessment, complains of a posterior headache. He has no complaints of chest pain or abdominal pain. He does have dementia. The patient was given 12.5 mg IM Phenergan2 at his facility without relief of emesis. They apparently checked for constipation which was negative.   The history is provided by the patient and the EMS personnel. No language interpreter was used.    Past Medical History:  Diagnosis Date  . Acute urinary retention 2009   Resolved after foley & flomax  . Colon polyps   . Difficulty in urination   . Diverticulosis of sigmoid colon   . Enlarged prostate   . Incarcerated incisional hernia 2009  . Macular degeneration    Right eye  . Osteoarthritis of both knees 08/17/2013  . Seborrheic keratoses, inflamed 2013  . Sleep apnea    pt has CPAP machine but does not wear at bedtime    Patient Active Problem List   Diagnosis Date Noted  . Sepsis secondary to UTI (HCC) 03/29/2017  . AKI (acute kidney injury) (HCC) 03/29/2017  . Dementia 03/29/2017  . Influenza-like illness 08/22/2016  . Sleep apnea 08/22/2016  . Nausea & vomiting 08/22/2016  . CAP (community acquired pneumonia) 08/22/2016  . Acute respiratory failure with hypoxia (HCC) 08/22/2016  . Hyperglycemia 08/22/2016    . Acute esophagitis 08/27/2013  . Melena 08/26/2013  . Hematemesis 08/25/2013  . Ileus, postoperative (HCC) 08/25/2013  . Hiatal hernia 08/17/2013  . Osteoarthritis of both knees 08/17/2013  . Recurrent bilateral inguinal hernia (BIH) s/p lap repair w mesh 08/23/2013 08/17/2013  . Difficulty in urination     Past Surgical History:  Procedure Laterality Date  . CATARACT EXTRACTION Left   . CATARACT EXTRACTION W/PHACO Right 05/22/2016   Procedure: CATARACT EXTRACTION PHACO AND INTRAOCULAR LENS PLACEMENT (IOC);  Surgeon: Nevada Crane, MD;  Location: [facility] ORS;  Service: Ophthalmology;  Laterality: Right;  Lot# [id number] H [place]: 03:33.8 AP%: 15.8 CDE: 33.90  . CHOLECYSTECTOMY  1987  . ESOPHAGOGASTRODUODENOSCOPY N/A 08/26/2013   Procedure: ESOPHAGOGASTRODUODENOSCOPY (EGD);  Surgeon: Shirley Friar, MD;  Location: [facility];  Service: Endoscopy;  Laterality: N/A;  . INGUINAL HERNIA REPAIR Right infant  . INGUINAL HERNIA REPAIR Bilateral 08/23/2013   Procedure: LAPAROSCOPIC Bilateral INGUINAL HERNIA Repairs and TAPP block    ;  Surgeon: Ardeth Sportsman, MD;  Location: [facility] OR;  Service: General;  Laterality: Bilateral;  . INSERTION OF MESH Bilateral 08/23/2013   Procedure: INSERTION OF MESH ;  Surgeon: Ardeth Sportsman, MD;  Location: [facility] OR;  Service: General;  Laterality: Bilateral;  . LAPAROSCOPIC ASSISTED VENTRAL HERNIA REPAIR  2009   Dr Ezzard Standing  . LAPAROSCOPIC LYSIS OF ADHESIONS N/A 08/23/2013   Procedure: LAPAROSCOPIC LYSIS OF ADHESIONS;  Surgeon: Ardeth Sportsman, MD;  Location: [facility] OR;  Service: General;  Laterality:  N/A;  . STOMACH SURGERY  1990   reflux surgery?       Home Medications    Prior to Admission medications   Medication Sig Start Date End Date Taking? Authorizing Provider  acetaminophen (TYLENOL) 500 MG tablet Take 500 mg by mouth daily. May also take q6hprn   Yes [provider]  cetirizine (ZYRTEC) 10 MG tablet Take 1 tablet (10 mg total) by mouth at  bedtime as needed for allergies. 08/26/16  Yes Short, Thea Silversmith, MD  Cholecalciferol (VITAMIN D-3) 1000 units CAPS Take 1,000 Units by mouth every evening.   Yes [provider]  feeding supplement, ENSURE ENLIVE, (ENSURE ENLIVE) LIQD Take 237 mLs by mouth 2 (two) times daily between meals. 08/27/16  Yes Short, Thea Silversmith, MD  memantine (NAMENDA) 10 MG tablet Take 10 mg by mouth every evening.    Yes [provider]  Omega-3 Fatty Acids (FISH OIL) 1200 MG CAPS Take 1,200 mg by mouth every evening.   Yes [provider]    Family History History reviewed. No pertinent family history.  Social History Social History  Substance Use Topics  . Smoking status: Former Smoker    Quit date: 08/12/1971  . Smokeless tobacco: Never Used  . Alcohol use 3.6 oz/week    6 Cans of beer per week     Comment: weekly     Allergies   Nsaids; Codeine; and Morphine and related   Review of Systems Review of Systems  Unable to perform ROS: Acuity of condition     Physical Exam Updated Vital Signs BP (!) 101/46 (BP Location: Right Arm)   Pulse 88   Temp 99.6 F (37.6 C) (Rectal)   Resp 20   Wt 77.1 kg (170 lb)   SpO2 95%   BMI 23.06 kg/m   Physical Exam  Constitutional: He is oriented to person, place, and time. He appears well-developed and well-nourished. No distress.  Chronically thin, frail appearing. Mild cachexia.   HENT:  Head: Normocephalic and atraumatic.  No facial drooping. Dry mm.  Eyes: Conjunctivae and EOM are normal. No scleral icterus.  Neck: Normal range of motion.  No meningismus  Cardiovascular: Regular rhythm and intact distal pulses.   Tachycardia  Pulmonary/Chest: Effort normal. No respiratory distress. He has no wheezes.  Lungs grossly clear. SpO2 92% on RA. No tachypnea or dyspnea.  Abdominal: Soft.  Large left sided ventral hernia. Well healed midline incision. Abdomen soft, without peritoneal signs.  Musculoskeletal: Normal range of  motion.  Neurological: He is alert and oriented to person, place, and time. No cranial nerve deficit. He exhibits normal muscle tone. Coordination normal.  GCS 15. Speech is goal oriented. Patient answers questions appropriately. No focal deficits appreciated.  Skin: Skin is warm and dry. No rash noted. He is not diaphoretic. No erythema. No pallor.  Psychiatric: He has a normal mood and affect. His behavior is normal.  Nursing note and vitals reviewed.    ED Treatments / Results  Labs (all labs ordered are listed, but only abnormal results are displayed) Labs Reviewed  COMPREHENSIVE METABOLIC PANEL - Abnormal; Notable for the following:       Result Value   CO2 17 (*)    Glucose, Bld 237 (*)    BUN 30 (*)    Creatinine, Ser 1.67 (*)    ALT 16 (*)    GFR calc non Af Amer 35 (*)    GFR calc Af Amer 41 (*)    Anion gap 16 (*)  All other components within normal limits  CBC WITH DIFFERENTIAL/PLATELET - Abnormal; Notable for the following:    WBC 13.1 (*)    RBC 5.85 (*)    Hemoglobin 17.2 (*)    Neutro Abs 10.7 (*)    Lymphs Abs 0.6 (*)    Monocytes Absolute 1.7 (*)    All other components within normal limits  URINALYSIS, ROUTINE W REFLEX MICROSCOPIC - Abnormal; Notable for the following:    Color, Urine AMBER (*)    APPearance CLOUDY (*)    Hgb urine dipstick SMALL (*)    Protein, ur 30 (*)    Leukocytes, UA LARGE (*)    Bacteria, UA RARE (*)    Squamous Epithelial / LPF 0-5 (*)    All other components within normal limits  I-STAT CG4 LACTIC ACID, ED - Abnormal; Notable for the following:    Lactic Acid, Venous 6.15 (*)    All other components within normal limits  I-STAT CHEM 8, ED - Abnormal; Notable for the following:    BUN 33 (*)    Creatinine, Ser 1.60 (*)    Glucose, Bld 241 (*)    Calcium, Ion 1.02 (*)    Hemoglobin 17.3 (*)    All other components within normal limits  CBG MONITORING, ED - Abnormal; Notable for the following:    Glucose-Capillary 216 (*)     All other components within normal limits  CULTURE, BLOOD (ROUTINE X 2)  CULTURE, BLOOD (ROUTINE X 2)  URINE CULTURE  PROTIME-INR  I-STAT TROPONIN, ED  I-STAT CG4 LACTIC ACID, ED  SAMPLE TO BLOOD BANK    EKG  EKG Interpretation  Date/Time:  Sunday March 29 2017 00:13:31 EDT Ventricular Rate:  94 PR Interval:    QRS Duration: 105 QT Interval:  359 QTC Calculation: 449 R Axis:   40 Text Interpretation:  Sinus rhythm Premature atrial complexes Nonspecific T abnrm, anterolateral leads PACs not seen previously Confirmed by Paula Libra ([id number]) on 03/29/2017 12:40:18 AM       Radiology Ct Head Wo Contrast  Result Date: 03/29/2017 CLINICAL DATA:  Acute onset of vomiting.  Initial encounter. EXAM: CT HEAD WITHOUT CONTRAST TECHNIQUE: Contiguous axial images were obtained from the base of the skull through the vertex without intravenous contrast. COMPARISON:  CT of the head performed 07/19/2009 FINDINGS: Brain: No evidence of acute infarction, hemorrhage, hydrocephalus, extra-axial collection or mass lesion/mass effect. Prominence of the ventricles and sulci reflects moderately severe cortical volume loss. Scattered periventricular and subcortical matter change likely reflects small vessel ischemic microangiopathy. Cerebellar atrophy is noted. The brainstem and fourth ventricle are within normal limits. The basal ganglia are unremarkable in appearance. The cerebral hemispheres demonstrate grossly normal gray-white differentiation. No mass effect or midline shift is seen. Vascular: No hyperdense vessel or unexpected calcification. Skull: There is no evidence of fracture; visualized osseous structures are unremarkable in appearance. Sinuses/Orbits: The orbits are within normal limits. The paranasal sinuses and mastoid air cells are well-aerated. Other: No significant soft tissue abnormalities are seen. IMPRESSION: 1. No acute intracranial pathology seen on CT. 2. Moderately severe cortical volume  loss and scattered small vessel ischemic microangiopathy. Electronically Signed   By: Roanna Raider M.D.   On: 03/29/2017 01:24   Ct Abdomen Pelvis W Contrast  Result Date: 03/29/2017 CLINICAL DATA:  Vomiting EXAM: CT ABDOMEN AND PELVIS WITH CONTRAST TECHNIQUE: Multidetector CT imaging of the abdomen and pelvis was performed using the standard protocol following bolus administration of intravenous contrast. CONTRAST:  80mL  ISOVUE-300 IOPAMIDOL (ISOVUE-300) INJECTION 61% COMPARISON:  03/29/2017, CT 09/25/2007 FINDINGS: Lower chest: Lung bases demonstrate atelectasis or scar in the right base. Calcified granuloma in the left lower lobe. Trace left pleural effusion. Mild coronary calcification. Normal heart size. Hepatobiliary: No focal liver abnormality is seen. Status post cholecystectomy. No biliary dilatation. Pancreas: Unremarkable. No pancreatic ductal dilatation or surrounding inflammatory changes. Spleen: Normal in size without focal abnormality. Adrenals/Urinary Tract: Adrenal glands are within normal limits. Cysts within the bilateral kidneys. Negative for hydronephrosis. No significant excretion on delayed images. Bladder thick-walled. Possible faint calcifications along the posterior bladder wall. Stomach/Bowel: Stomach is nonenlarged. Fluid-filled distal esophagus with mild thickening at the GE junction. No dilated small bowel. No colon wall thickening. Sigmoid colon diverticular disease without acute inflammation. Vascular/Lymphatic: Aortic atherosclerosis. No aneurysmal dilatation. No significantly enlarged lymph nodes. Reproductive: Mild prostate calcification Other: Negative for free air or significant free fluid. Large left abdominal wall ventral hernia containing mesenteric fat and bowel. Wall defect measures at least 7.2 cm. Musculoskeletal: Degenerative changes of the spine. Grade 1 anterolisthesis of L4 on L5. Mild superior endplate compression at L1. IMPRESSION: 1. No CT evidence for bowel  obstruction or significant bowel wall thickening 2. Large left abdominal ventral hernia containing mesentery and small bowel, no evidence for incarceration or obstruction at this time. 3. Trace left pleural effusion 4. Thick-walled appearance of the urinary bladder with possible faint posterior bladder wall calcification, correlate clinically to exclude a cystitis Electronically Signed   By: Jasmine Pang M.D.   On: 03/29/2017 02:55   Dg Chest Port 1 View  Result Date: 03/29/2017 CLINICAL DATA:  Sepsis, vomiting EXAM: PORTABLE CHEST 1 VIEW COMPARISON:  08/23/2016 FINDINGS: Low lung volumes with bibasilar atelectasis. No pleural effusion. Stable cardiomediastinal silhouette. IMPRESSION: Low lung volumes with mild bibasilar atelectasis Electronically Signed   By: Jasmine Pang M.D.   On: 03/29/2017 00:41   Dg Abd Portable 1 View  Result Date: 03/29/2017 CLINICAL DATA:  Vomiting EXAM: PORTABLE ABDOMEN - 1 VIEW COMPARISON:  08/22/2016 FINDINGS: Status post hernia repair. Nonobstructed bowel gas pattern with large amount of stool in the colon. No abnormal calcification. IMPRESSION: Nonobstructed gas pattern with large amount of stool in the colon Electronically Signed   By: Jasmine Pang M.D.   On: 03/29/2017 00:42    Procedures Procedures (including critical care time)  Medications Ordered in ED Medications  iopamidol (ISOVUE-300) 61 % injection (not administered)  lactated ringers infusion ( Intravenous New Bag/Given 03/29/17 0323)  sodium chloride 0.9 % bolus 1,000 mL (0 mLs Intravenous Stopped 03/29/17 0047)    And  sodium chloride 0.9 % bolus 1,000 mL (0 mLs Intravenous Stopped 03/29/17 0114)    And  sodium chloride 0.9 % bolus 1,000 mL (0 mLs Intravenous Stopped 03/29/17 0229)  piperacillin-tazobactam (ZOSYN) IVPB 3.375 g (0 g Intravenous Stopped 03/29/17 0114)  vancomycin (VANCOCIN) IVPB 1000 mg/200 mL premix (0 mg Intravenous Stopped 03/29/17 0229)  acetaminophen (TYLENOL) suppository 650 mg  (650 mg Rectal Given 03/29/17 0042)  iopamidol (ISOVUE-300) 61 % injection 80 mL (80 mLs Intravenous Contrast Given 03/29/17 0233)     Initial Impression / Assessment and Plan / ED Course  I have reviewed the triage vital signs and the nursing notes.  Pertinent labs & imaging results that were available during my care of the patient were reviewed by me and considered in my medical decision making (see chart for details).     12:00 AM Patient presenting from his skilled nursing facility for  persistent vomiting. He arrived tachycardic and hypotensive. He was noted to have a low-grade fever to 100.108F rectally. Sepsis order set initiated with IV fluids and antibiotics. Patient with history of dementia, though he does state that he is experiencing a mild headache currently. He has a large ventral hernia, but a soft abdomen. Portable chest and abdominal x-rays ordered in addition to head CT.  1:45 AM Head CT negative for acute findings. Chest x-ray negative for pneumonia and abdominal x-ray shows colonic stool burden without obstruction. Given persistent vomiting prior to arrival with leukocytosis and elevated lactate, will obtain CT abdomen and pelvis to ensure no hernia complications. Urinalysis pending.  3:05 AM In and out catheter completed and urinalysis notable for pyuria. Urine culture pending. Suspect sepsis to be secondary to UTI as CT today is also reassuring. Vitals rechecked. BP 96/53; HR 88; SpO2 97% on 2L [place]. Will administer LR infusion and repeat lactate. Plan for admission for sepsis management.   Final Clinical Impressions(s) / ED Diagnoses   Final diagnoses:  Severe sepsis (HCC)  Acute UTI (urinary tract infection)  AKI (acute kidney injury) [facility])    New Prescriptions New Prescriptions   No medications on file     Antony Madura, Cordelia Poche 03/29/17 0328    Paula Libra, MD 03/29/17 [phone]

## 2017-03-29 ENCOUNTER — Emergency Department (HOSPITAL_COMMUNITY): Payer: Medicare Other

## 2017-03-29 ENCOUNTER — Encounter (HOSPITAL_COMMUNITY): Payer: Self-pay | Admitting: Radiology

## 2017-03-29 DIAGNOSIS — G4730 Sleep apnea, unspecified: Secondary | ICD-10-CM | POA: Diagnosis not present

## 2017-03-29 DIAGNOSIS — F05 Delirium due to known physiological condition: Secondary | ICD-10-CM | POA: Diagnosis present

## 2017-03-29 DIAGNOSIS — N179 Acute kidney failure, unspecified: Secondary | ICD-10-CM | POA: Diagnosis present

## 2017-03-29 DIAGNOSIS — R112 Nausea with vomiting, unspecified: Secondary | ICD-10-CM | POA: Diagnosis not present

## 2017-03-29 DIAGNOSIS — Z882 Allergy status to sulfonamides status: Secondary | ICD-10-CM | POA: Diagnosis not present

## 2017-03-29 DIAGNOSIS — M170 Bilateral primary osteoarthritis of knee: Secondary | ICD-10-CM | POA: Diagnosis present

## 2017-03-29 DIAGNOSIS — E860 Dehydration: Secondary | ICD-10-CM | POA: Diagnosis present

## 2017-03-29 DIAGNOSIS — K439 Ventral hernia without obstruction or gangrene: Secondary | ICD-10-CM | POA: Diagnosis present

## 2017-03-29 DIAGNOSIS — N390 Urinary tract infection, site not specified: Secondary | ICD-10-CM | POA: Diagnosis present

## 2017-03-29 DIAGNOSIS — E872 Acidosis: Secondary | ICD-10-CM | POA: Diagnosis present

## 2017-03-29 DIAGNOSIS — Z885 Allergy status to narcotic agent status: Secondary | ICD-10-CM | POA: Diagnosis not present

## 2017-03-29 DIAGNOSIS — K5730 Diverticulosis of large intestine without perforation or abscess without bleeding: Secondary | ICD-10-CM | POA: Diagnosis present

## 2017-03-29 DIAGNOSIS — A419 Sepsis, unspecified organism: Secondary | ICD-10-CM | POA: Diagnosis present

## 2017-03-29 DIAGNOSIS — R739 Hyperglycemia, unspecified: Secondary | ICD-10-CM

## 2017-03-29 DIAGNOSIS — F0390 Unspecified dementia without behavioral disturbance: Secondary | ICD-10-CM | POA: Diagnosis present

## 2017-03-29 DIAGNOSIS — G4733 Obstructive sleep apnea (adult) (pediatric): Secondary | ICD-10-CM | POA: Diagnosis present

## 2017-03-29 DIAGNOSIS — Z87891 Personal history of nicotine dependence: Secondary | ICD-10-CM | POA: Diagnosis not present

## 2017-03-29 DIAGNOSIS — H3530 Unspecified macular degeneration: Secondary | ICD-10-CM | POA: Diagnosis present

## 2017-03-29 DIAGNOSIS — R6520 Severe sepsis without septic shock: Secondary | ICD-10-CM

## 2017-03-29 DIAGNOSIS — K219 Gastro-esophageal reflux disease without esophagitis: Secondary | ICD-10-CM | POA: Diagnosis present

## 2017-03-29 DIAGNOSIS — N400 Enlarged prostate without lower urinary tract symptoms: Secondary | ICD-10-CM | POA: Diagnosis present

## 2017-03-29 LAB — I-STAT TROPONIN, ED: Troponin i, poc: 0.02 ng/mL (ref 0.00–0.08)

## 2017-03-29 LAB — CBC WITH DIFFERENTIAL/PLATELET
BASOS ABS: 0 10*3/uL (ref 0.0–0.1)
BASOS ABS: 0 10*3/uL (ref 0.0–0.1)
Basophils Relative: 0 %
Basophils Relative: 0 %
EOS PCT: 0 %
EOS PCT: 0 %
Eosinophils Absolute: 0 10*3/uL (ref 0.0–0.7)
Eosinophils Absolute: 0.1 10*3/uL (ref 0.0–0.7)
HEMATOCRIT: 42.1 % (ref 39.0–52.0)
HEMATOCRIT: 49.8 % (ref 39.0–52.0)
HEMOGLOBIN: 17.2 g/dL — AB (ref 13.0–17.0)
Hemoglobin: 13.8 g/dL (ref 13.0–17.0)
LYMPHS ABS: 0.6 10*3/uL — AB (ref 0.7–4.0)
LYMPHS ABS: 1.2 10*3/uL (ref 0.7–4.0)
LYMPHS PCT: 5 %
LYMPHS PCT: 7 %
MCH: 28.9 pg (ref 26.0–34.0)
MCH: 29.4 pg (ref 26.0–34.0)
MCHC: 32.8 g/dL (ref 30.0–36.0)
MCHC: 34.5 g/dL (ref 30.0–36.0)
MCV: 85.1 fL (ref 78.0–100.0)
MCV: 88.1 fL (ref 78.0–100.0)
MONO ABS: 1.3 10*3/uL — AB (ref 0.1–1.0)
MONOS PCT: 8 %
Monocytes Absolute: 1.7 10*3/uL — ABNORMAL HIGH (ref 0.1–1.0)
Monocytes Relative: 13 %
NEUTROS ABS: 10.7 10*3/uL — AB (ref 1.7–7.7)
NEUTROS ABS: 13.8 10*3/uL — AB (ref 1.7–7.7)
NEUTROS PCT: 82 %
Neutrophils Relative %: 85 %
PLATELETS: 298 10*3/uL (ref 150–400)
Platelets: 194 10*3/uL (ref 150–400)
RBC: 4.78 MIL/uL (ref 4.22–5.81)
RBC: 5.85 MIL/uL — AB (ref 4.22–5.81)
RDW: 15.2 % (ref 11.5–15.5)
RDW: 15.7 % — AB (ref 11.5–15.5)
WBC: 13.1 10*3/uL — AB (ref 4.0–10.5)
WBC: 16.3 10*3/uL — ABNORMAL HIGH (ref 4.0–10.5)

## 2017-03-29 LAB — GLUCOSE, CAPILLARY
GLUCOSE-CAPILLARY: 92 mg/dL (ref 65–99)
GLUCOSE-CAPILLARY: 108 mg/dL — AB (ref 65–99)
Glucose-Capillary: 107 mg/dL — ABNORMAL HIGH (ref 65–99)
Glucose-Capillary: 98 mg/dL (ref 65–99)

## 2017-03-29 LAB — I-STAT CHEM 8, ED
BUN: 33 mg/dL — ABNORMAL HIGH (ref 6–20)
CREATININE: 1.6 mg/dL — AB (ref 0.61–1.24)
Calcium, Ion: 1.02 mmol/L — ABNORMAL LOW (ref 1.15–1.40)
Chloride: 107 mmol/L (ref 101–111)
GLUCOSE: 241 mg/dL — AB (ref 65–99)
HCT: 51 % (ref 39.0–52.0)
HEMOGLOBIN: 17.3 g/dL — AB (ref 13.0–17.0)
Potassium: 4.1 mmol/L (ref 3.5–5.1)
SODIUM: 139 mmol/L (ref 135–145)
TCO2: 18 mmol/L (ref 0–100)

## 2017-03-29 LAB — URINALYSIS, ROUTINE W REFLEX MICROSCOPIC
BILIRUBIN URINE: NEGATIVE
GLUCOSE, UA: NEGATIVE mg/dL
Ketones, ur: NEGATIVE mg/dL
Nitrite: NEGATIVE
PROTEIN: 30 mg/dL — AB
SPECIFIC GRAVITY, URINE: 1.023 (ref 1.005–1.030)
pH: 5 (ref 5.0–8.0)

## 2017-03-29 LAB — BASIC METABOLIC PANEL
ANION GAP: 11 (ref 5–15)
BUN: 34 mg/dL — AB (ref 6–20)
CHLORIDE: 109 mmol/L (ref 101–111)
CO2: 21 mmol/L — AB (ref 22–32)
Calcium: 8 mg/dL — ABNORMAL LOW (ref 8.9–10.3)
Creatinine, Ser: 1.39 mg/dL — ABNORMAL HIGH (ref 0.61–1.24)
GFR calc Af Amer: 51 mL/min — ABNORMAL LOW (ref >60)
GFR calc non Af Amer: 44 mL/min — ABNORMAL LOW (ref >60)
GLUCOSE: 137 mg/dL — AB (ref 65–99)
POTASSIUM: 4.2 mmol/L (ref 3.5–5.1)
Sodium: 141 mmol/L (ref 135–145)

## 2017-03-29 LAB — COMPREHENSIVE METABOLIC PANEL
ALT: 16 U/L — ABNORMAL LOW (ref 17–63)
ANION GAP: 16 — AB (ref 5–15)
AST: 32 U/L (ref 15–41)
Albumin: 3.5 g/dL (ref 3.5–5.0)
Alkaline Phosphatase: 93 U/L (ref 38–126)
BILIRUBIN TOTAL: 1.2 mg/dL (ref 0.3–1.2)
BUN: 30 mg/dL — ABNORMAL HIGH (ref 6–20)
CHLORIDE: 107 mmol/L (ref 101–111)
CO2: 17 mmol/L — ABNORMAL LOW (ref 22–32)
Calcium: 9.2 mg/dL (ref 8.9–10.3)
Creatinine, Ser: 1.67 mg/dL — ABNORMAL HIGH (ref 0.61–1.24)
GFR, EST AFRICAN AMERICAN: 41 mL/min — AB (ref >60)
GFR, EST NON AFRICAN AMERICAN: 35 mL/min — AB (ref >60)
Glucose, Bld: 237 mg/dL — ABNORMAL HIGH (ref 65–99)
POTASSIUM: 4.3 mmol/L (ref 3.5–5.1)
Sodium: 140 mmol/L (ref 135–145)
TOTAL PROTEIN: 6.9 g/dL (ref 6.5–8.1)

## 2017-03-29 LAB — CBG MONITORING, ED
GLUCOSE-CAPILLARY: 137 mg/dL — AB (ref 65–99)
Glucose-Capillary: 107 mg/dL — ABNORMAL HIGH (ref 65–99)
Glucose-Capillary: 216 mg/dL — ABNORMAL HIGH (ref 65–99)

## 2017-03-29 LAB — PROTIME-INR
INR: 1.02
Prothrombin Time: 13.4 seconds (ref 11.4–15.2)

## 2017-03-29 LAB — LACTIC ACID, PLASMA
Lactic Acid, Venous: 4.2 mmol/L (ref 0.5–1.9)
Lactic Acid, Venous: 2.9 mmol/L (ref 0.5–1.9)

## 2017-03-29 LAB — I-STAT CG4 LACTIC ACID, ED
LACTIC ACID, VENOUS: 6.15 mmol/L — AB (ref 0.5–1.9)
Lactic Acid, Venous: 6.15 mmol/L (ref 0.5–1.9)

## 2017-03-29 LAB — SAMPLE TO BLOOD BANK

## 2017-03-29 LAB — MRSA PCR SCREENING: MRSA by PCR: NEGATIVE

## 2017-03-29 LAB — PROCALCITONIN: Procalcitonin: 124.44 ng/mL

## 2017-03-29 MED ORDER — MEMANTINE HCL 10 MG PO TABS
10.0000 mg | ORAL_TABLET | Freq: Every evening | ORAL | Status: DC
  Administered 2017-03-29 – 2017-03-30 (×2): 10 mg via ORAL
  Filled 2017-03-29: qty 1
  Filled 2017-03-29: qty 2

## 2017-03-29 MED ORDER — ONDANSETRON HCL 4 MG/2ML IJ SOLN
4.0000 mg | Freq: Four times a day (QID) | INTRAMUSCULAR | Status: DC | PRN
Start: 2017-03-29 — End: 2017-03-31

## 2017-03-29 MED ORDER — FENTANYL CITRATE (PF) 100 MCG/2ML IJ SOLN: 25.0000 ug | INTRAMUSCULAR | Status: DC | PRN

## 2017-03-29 MED ORDER — IOPAMIDOL (ISOVUE-300) INJECTION 61%
80.0000 mL | Freq: Once | INTRAVENOUS | Status: AC | PRN
  Administered 2017-03-29: 80 mL via INTRAVENOUS

## 2017-03-29 MED ORDER — IOPAMIDOL (ISOVUE-300) INJECTION 61%
INTRAVENOUS | Status: AC
  Filled 2017-03-29: qty 100

## 2017-03-29 MED ORDER — HYDROCODONE-ACETAMINOPHEN 5-325 MG PO TABS: 1.0000 | ORAL_TABLET | ORAL | Status: DC | PRN

## 2017-03-29 MED ORDER — HEPARIN SODIUM (PORCINE) 5000 UNIT/ML IJ SOLN
5000.0000 [IU] | Freq: Three times a day (TID) | INTRAMUSCULAR | Status: DC
  Administered 2017-03-29 – 2017-03-31 (×7): 5000 [IU] via SUBCUTANEOUS
  Filled 2017-03-29 (×9): qty 1

## 2017-03-29 MED ORDER — CALAMINE EX LOTN
TOPICAL_LOTION | CUTANEOUS | Status: DC | PRN
  Filled 2017-03-29: qty 118

## 2017-03-29 MED ORDER — DEXTROSE 5 % IV SOLN
2.0000 g | Freq: Once | INTRAVENOUS | Status: AC
  Administered 2017-03-29: 2 g via INTRAVENOUS
  Filled 2017-03-29: qty 2

## 2017-03-29 MED ORDER — POLYETHYLENE GLYCOL 3350 17 G PO PACK: 17.0000 g | PACK | Freq: Every day | ORAL | Status: DC | PRN

## 2017-03-29 MED ORDER — VITAMIN D 1000 UNITS PO TABS
1000.0000 [IU] | ORAL_TABLET | Freq: Every evening | ORAL | Status: DC
  Administered 2017-03-29 – 2017-03-30 (×2): 1000 [IU] via ORAL
  Filled 2017-03-29 (×2): qty 1

## 2017-03-29 MED ORDER — DEXTROSE 5 % IV SOLN
2.0000 g | INTRAVENOUS | Status: DC
  Administered 2017-03-30: 2 g via INTRAVENOUS
  Filled 2017-03-29 (×2): qty 2

## 2017-03-29 MED ORDER — ENSURE ENLIVE PO LIQD
237.0000 mL | Freq: Two times a day (BID) | ORAL | Status: DC
  Administered 2017-03-29 – 2017-03-31 (×4): 237 mL via ORAL
  Filled 2017-03-29 (×2): qty 237

## 2017-03-29 MED ORDER — INSULIN ASPART 100 UNIT/ML ~~LOC~~ SOLN: 0.0000 [IU] | SUBCUTANEOUS | Status: DC

## 2017-03-29 MED ORDER — PIPERACILLIN-TAZOBACTAM 3.375 G IVPB 30 MIN
3.3750 g | Freq: Once | INTRAVENOUS | Status: AC
  Administered 2017-03-29: 3.375 g via INTRAVENOUS
  Filled 2017-03-29: qty 50

## 2017-03-29 MED ORDER — LACTATED RINGERS IV SOLN
INTRAVENOUS | Status: DC
  Administered 2017-03-29: 03:00:00 via INTRAVENOUS

## 2017-03-29 MED ORDER — DOCUSATE SODIUM 100 MG PO CAPS
100.0000 mg | ORAL_CAPSULE | Freq: Two times a day (BID) | ORAL | Status: DC
  Administered 2017-03-29 – 2017-03-31 (×5): 100 mg via ORAL
  Filled 2017-03-29 (×5): qty 1

## 2017-03-29 MED ORDER — ONDANSETRON HCL 4 MG PO TABS: 4.0000 mg | ORAL_TABLET | Freq: Four times a day (QID) | ORAL | Status: DC | PRN

## 2017-03-29 MED ORDER — SODIUM CHLORIDE 0.9 % IV SOLN
INTRAVENOUS | Status: AC
  Administered 2017-03-29: 04:00:00 via INTRAVENOUS

## 2017-03-29 MED ORDER — BISACODYL 5 MG PO TBEC
5.0000 mg | DELAYED_RELEASE_TABLET | Freq: Every day | ORAL | Status: DC | PRN
  Filled 2017-03-29: qty 1

## 2017-03-29 MED ORDER — HALOPERIDOL LACTATE 5 MG/ML IJ SOLN
1.0000 mg | Freq: Four times a day (QID) | INTRAMUSCULAR | Status: DC | PRN
  Administered 2017-03-29 – 2017-03-31 (×3): 1 mg via INTRAVENOUS
  Filled 2017-03-29 (×3): qty 1

## 2017-03-29 MED ORDER — MAGNESIUM CITRATE PO SOLN: 1.0000 | Freq: Once | ORAL | Status: DC | PRN

## 2017-03-29 MED ORDER — VANCOMYCIN HCL IN DEXTROSE 1-5 GM/200ML-% IV SOLN
1000.0000 mg | Freq: Once | INTRAVENOUS | Status: AC
  Administered 2017-03-29: 1000 mg via INTRAVENOUS
  Filled 2017-03-29: qty 200

## 2017-03-29 MED ORDER — ACETAMINOPHEN 325 MG PO TABS
650.0000 mg | ORAL_TABLET | Freq: Four times a day (QID) | ORAL | Status: DC | PRN
  Administered 2017-03-30: 650 mg via ORAL
  Filled 2017-03-29: qty 2

## 2017-03-29 MED ORDER — ACETAMINOPHEN 650 MG RE SUPP: 650.0000 mg | Freq: Four times a day (QID) | RECTAL | Status: DC | PRN

## 2017-03-29 MED ORDER — SODIUM CHLORIDE 0.9% FLUSH
3.0000 mL | Freq: Two times a day (BID) | INTRAVENOUS | Status: DC
  Administered 2017-03-29 – 2017-03-30 (×5): 3 mL via INTRAVENOUS

## 2017-03-29 MED ORDER — LORATADINE 10 MG PO TABS: 10.0000 mg | ORAL_TABLET | Freq: Every day | ORAL | Status: DC | PRN

## 2017-03-29 MED ORDER — OMEGA-3-ACID ETHYL ESTERS 1 G PO CAPS
1.0000 g | ORAL_CAPSULE | Freq: Every day | ORAL | Status: DC
  Administered 2017-03-30 – 2017-03-31 (×2): 1 g via ORAL
  Filled 2017-03-29 (×3): qty 1

## 2017-03-29 MED ORDER — ACETAMINOPHEN 650 MG RE SUPP
650.0000 mg | Freq: Once | RECTAL | Status: AC
  Administered 2017-03-29: 650 mg via RECTAL
  Filled 2017-03-29: qty 1

## 2017-03-29 NOTE — ED Notes (Signed)
Notified primary RN Chelsea of lactic acid: 4.2

## 2017-03-29 NOTE — ED Notes (Signed)
Patient transported to CT 

## 2017-03-29 NOTE — ED Notes (Signed)
Writer notified PA Humes of I-stat lactic result.

## 2017-03-29 NOTE — ED Notes (Signed)
Spoke with Ephriam Knuckles, Charity fundraiser. 20 min timer started.

## 2017-03-29 NOTE — Progress Notes (Signed)
Pharmacy Antibiotic Note  Nicholas Herring is a 83 y.o. male admitted on 03/28/2017 with sepsis and UTI.  Pharmacy has been consulted for Cefepime dosing.  Plan: Cefepime 2gm iv q24hr  Height: 6' 0.05" (183 cm) Weight: 170 lb (77.1 kg) IBW/kg (Calculated) : 77.71  Temp (24hrs), Avg:99.3 F (37.4 C), Min:97.7 F (36.5 C), Max:100.5 F (38.1 C)   Recent Labs Lab 03/28/17 2356 03/29/17 0015 03/29/17 0016 03/29/17 0332  WBC 13.1*  --   --   --   CREATININE 1.67* 1.60*  --   --   LATICACIDVEN  --   --  6.15* 6.15*    Estimated Creatinine Clearance: 34.8 mL/min (A) (by C-G formula based on SCr of 1.6 mg/dL (H)).    Allergies  Allergen Reactions  . Nsaids Other (See Comments)    Ulceration   . Codeine Other (See Comments)    Unknown reaction  . Morphine And Related Nausea And Vomiting    Antimicrobials this admission: Vancomycin 03/29/2017 x1 Zosyn 03/29/2017 x1 Cefepime 03/29/2017 >>   Dose adjustments this admission: -  Microbiology results: pending  Thank you for allowing pharmacy to be a part of this patient's care.  Nicholas Herring 03/29/2017 4:59 AM

## 2017-03-29 NOTE — Progress Notes (Signed)
[facility] PLAN OF CARE NOTE Patient: Nicholas Herring DJM:[id number]   PCP: Jonita Albee, MD DOB: April 19, 1927   DOA: 03/28/2017   DOS: 03/29/2017    Patient was admitted by my colleague Dr. Antionette Char earlier on 03/29/2017. I have reviewed the H&P as well as assessment and plan and agree with the same. Important changes in the plan are listed below.  Plan of care: Principal Problem:   Sepsis secondary to UTI [facility]) Active Problems:   Sleep apnea   Nausea & vomiting   Hyperglycemia   AKI (acute kidney injury) (HCC)   Dementia   Author: Lynden Oxford, MD [facility] Pager: [phone] 03/29/2017 1:47 PM   If 7PM-7AM, please contact night-coverage at [email or web address], password [facility]

## 2017-03-29 NOTE — Progress Notes (Signed)
Pt admitted for N&V. Pt is resting comfortably at this time on room air. CPAP not needed. RT will continue to monitor pt.

## 2017-03-29 NOTE — H&P (Signed)
History and Physical    Nicholas Herring UJW:[id number] DOB: 11/15/1926 DOA: 03/28/2017  PCP: Jonita Albee, MD   Patient coming from: SNF  Chief Complaint: Nausea, vomiting, low BP   HPI: Nicholas Herring is a 83 y.o. male with medical history significant for dementia, BPH, osteoarthritis, obstructive sleep apnea, and incarcerated incisional hernia, now presenting to the emergency department from his SNF for evaluation of nausea with vomiting and low blood pressures. Patient was noted by SNF personnel to have 4 episodes of vomiting tonight. His blood pressure was also noted to be on the low side and EMS was called out for transport to the hospital. Patient did not have any specific complaints. He was reportedly afebrile at the nursing facility. No diarrhea reported. He was treated with Phenergan 2 prior to transport to the ED.   ED Course: Upon arrival to the ED, patient is found to be febrile to 38.1 C, saturating low 90s on room air, slightly tachycardic, and with blood pressure 78/54. EKG features a sinus rhythm with PACs and nonspecific T-wave abnormality in the anterolateral leads. Chest x-ray features low lung volumes with mild bibasilar atelectasis and KUB demonstrates a nonobstructive bowel gas pattern with large stool bur Non-contrast head CT was negative for acute intracranial abnormality and CT of the abdomen and pelvis is notable for bladder wall thickening. Chemistry panel reveals a bicarbonate of 17, anion gap 16, glucose 237, and creatinine 1.67, up from 0.61 in January.CBC features a leukocytosis to 13,100 and a elevated hemoglobin of 17.2. Troponin is within the normal limits, urinalysis is consistent with infection, and lactic acid is elevated to 6.15. Blood and urine cultures were obtained, 3 L of normal saline were given, patient was treated with acetaminophen, and started on empiric vancomycin and Zosyn. Blood pressure improved and remained stable and tachycardia resolved. He will  be admitted to the stepdown unit for ongoing evaluation and management of severe sepsis secondary to UTI.   Review of Systems:  Unable to complete ROS secondary to patient's clinical condition.  Past Medical History:  Diagnosis Date  . Acute urinary retention 2009   Resolved after foley & flomax  . Colon polyps   . Difficulty in urination   . Diverticulosis of sigmoid colon   . Enlarged prostate   . Incarcerated incisional hernia 2009  . Macular degeneration    Right eye  . Osteoarthritis of both knees 08/17/2013  . Seborrheic keratoses, inflamed 2013  . Sleep apnea    pt has CPAP machine but does not wear at bedtime    Past Surgical History:  Procedure Laterality Date  . CATARACT EXTRACTION Left   . CATARACT EXTRACTION W/PHACO Right 05/22/2016   Procedure: CATARACT EXTRACTION PHACO AND INTRAOCULAR LENS PLACEMENT (IOC);  Surgeon: Nevada Crane, MD;  Location: [facility];  Service: Ophthalmology;  Laterality: Right;  Lot# [id number] H [place]: 03:33.8 AP%: 15.8 CDE: 33.90  . CHOLECYSTECTOMY  1987  . ESOPHAGOGASTRODUODENOSCOPY N/A 08/26/2013   Procedure: ESOPHAGOGASTRODUODENOSCOPY (EGD);  Surgeon: Shirley Friar, MD;  Location: [facility];  Service: Endoscopy;  Laterality: N/A;  . INGUINAL HERNIA REPAIR Right infant  . INGUINAL HERNIA REPAIR Bilateral 08/23/2013   Procedure: LAPAROSCOPIC Bilateral INGUINAL HERNIA Repairs and TAPP block    ;  Surgeon: Ardeth Sportsman, MD;  Location: [facility];  Service: General;  Laterality: Bilateral;  . INSERTION OF MESH Bilateral 08/23/2013   Procedure: INSERTION OF MESH ;  Surgeon: Ardeth Sportsman, MD;  Location: [facility] OR;  Service:  General;  Laterality: Bilateral;  . LAPAROSCOPIC ASSISTED VENTRAL HERNIA REPAIR  2009   Dr Ezzard Standing  . LAPAROSCOPIC LYSIS OF ADHESIONS N/A 08/23/2013   Procedure: LAPAROSCOPIC LYSIS OF ADHESIONS;  Surgeon: Ardeth Sportsman, MD;  Location: [facility] OR;  Service: General;  Laterality: N/A;  . STOMACH SURGERY  1990   reflux surgery?      reports that he quit smoking about 45 years ago. He has never used smokeless tobacco. He reports that he drinks about 3.6 oz of alcohol per week . He reports that he does not use drugs.  Allergies  Allergen Reactions  . Nsaids Other (See Comments)    Ulceration   . Codeine Other (See Comments)    Unknown reaction  . Morphine And Related Nausea And Vomiting    History reviewed. No pertinent family history.   Prior to Admission medications   Medication Sig Start Date End Date Taking? Authorizing Provider  acetaminophen (TYLENOL) 500 MG tablet Take 500 mg by mouth daily. May also take q6hprn   Yes [provider]  cetirizine (ZYRTEC) 10 MG tablet Take 1 tablet (10 mg total) by mouth at bedtime as needed for allergies. 08/26/16  Yes Short, Thea Silversmith, MD  Cholecalciferol (VITAMIN D-3) 1000 units CAPS Take 1,000 Units by mouth every evening.   Yes [provider]  feeding supplement, ENSURE ENLIVE, (ENSURE ENLIVE) LIQD Take 237 mLs by mouth 2 (two) times daily between meals. 08/27/16  Yes Short, Thea Silversmith, MD  memantine (NAMENDA) 10 MG tablet Take 10 mg by mouth every evening.    Yes [provider]  Omega-3 Fatty Acids (FISH OIL) 1200 MG CAPS Take 1,200 mg by mouth every evening.   Yes [provider]    Physical Exam: Vitals:   03/29/17 0145 03/29/17 0200 03/29/17 0300 03/29/17 0321  BP:  (!) 101/46    Pulse: 97 96 88   Resp: 19 20 20    Temp:    99.6 F (37.6 C)  TempSrc:    Rectal  SpO2: 96% 100% 95%   Weight:          Constitutional: NAD, calm, lethargic Eyes: PERTLA, lids and conjunctivae normal ENMT: Mucous membranes are dry. Posterior pharynx clear of any exudate or lesions.   Neck: normal, supple, no masses, no thyromegaly Respiratory: clear to auscultation bilaterally, no wheezing, no crackles. Normal respiratory effort.  Cardiovascular: S1 & S2 heard, regular rate and rhythm. No extremity edema. No significant JVD. Abdomen: No  distension, soft. Mild tenderness in lower quadrants. Bowel sounds normal.  Musculoskeletal: no clubbing / cyanosis. No joint deformity upper and lower extremities. Normal muscle tone.  Skin: no significant rashes, lesions, ulcers. Poor turgor. Neurologic: No gross facial asymmetry. Patellar DTR's normal. Moving all extremities spontaneously.  Psychiatric: Lethargic, easily roused. Pleasant and cooperative.     Labs on Admission: I have personally reviewed following labs and imaging studies  CBC:  Recent Labs Lab 03/28/17 2356 03/29/17 0015  WBC 13.1*  --   NEUTROABS 10.7*  --   HGB 17.2* 17.3*  HCT 49.8 51.0  MCV 85.1  --   PLT 298  --    Basic Metabolic Panel:  Recent Labs Lab 03/28/17 2356 03/29/17 0015  NA 140 139  K 4.3 4.1  CL 107 107  CO2 17*  --   GLUCOSE 237* 241*  BUN 30* 33*  CREATININE 1.67* 1.60*  CALCIUM 9.2  --    GFR: CrCl cannot be calculated (Unknown ideal weight.). Liver Function Tests:  Recent Labs Lab 03/28/17 2356  AST 32  ALT 16*  ALKPHOS 93  BILITOT 1.2  PROT 6.9  ALBUMIN 3.5   No results for input(s): LIPASE, AMYLASE in the last 168 hours. No results for input(s): AMMONIA in the last 168 hours. Coagulation Profile:  Recent Labs Lab 03/28/17 2356  INR 1.02   Cardiac Enzymes: No results for input(s): CKTOTAL, CKMB, CKMBINDEX, TROPONINI in the last 168 hours. BNP (last 3 results) No results for input(s): PROBNP in the last 8760 hours. HbA1C: No results for input(s): HGBA1C in the last 72 hours. CBG:  Recent Labs Lab 03/29/17 0016  GLUCAP 216*   Lipid Profile: No results for input(s): CHOL, HDL, LDLCALC, TRIG, CHOLHDL, LDLDIRECT in the last 72 hours. Thyroid Function Tests: No results for input(s): TSH, T4TOTAL, FREET4, T3FREE, THYROIDAB in the last 72 hours. Anemia Panel: No results for input(s): VITAMINB12, FOLATE, FERRITIN, TIBC, IRON, RETICCTPCT in the last 72 hours. Urine analysis:    Component Value Date/Time    COLORURINE AMBER (A) 03/28/2017 2356   APPEARANCEUR CLOUDY (A) 03/28/2017 2356   LABSPEC 1.023 03/28/2017 2356   PHURINE 5.0 03/28/2017 2356   GLUCOSEU NEGATIVE 03/28/2017 2356   HGBUR SMALL (A) 03/28/2017 2356   BILIRUBINUR NEGATIVE 03/28/2017 2356   KETONESUR NEGATIVE 03/28/2017 2356   PROTEINUR 30 (A) 03/28/2017 2356   UROBILINOGEN 1.0 08/25/2013 1228   NITRITE NEGATIVE 03/28/2017 2356   LEUKOCYTESUR LARGE (A) 03/28/2017 2356   Sepsis Labs: @LABRCNTIP (procalcitonin:4,lacticidven:4) )No results found for this or any previous visit (from the past 240 hour(s)).   Radiological Exams on Admission: Ct Head Wo Contrast  Result Date: 03/29/2017 CLINICAL DATA:  Acute onset of vomiting.  Initial encounter. EXAM: CT HEAD WITHOUT CONTRAST TECHNIQUE: Contiguous axial images were obtained from the base of the skull through the vertex without intravenous contrast. COMPARISON:  CT of the head performed 07/19/2009 FINDINGS: Brain: No evidence of acute infarction, hemorrhage, hydrocephalus, extra-axial collection or mass lesion/mass effect. Prominence of the ventricles and sulci reflects moderately severe cortical volume loss. Scattered periventricular and subcortical matter change likely reflects small vessel ischemic microangiopathy. Cerebellar atrophy is noted. The brainstem and fourth ventricle are within normal limits. The basal ganglia are unremarkable in appearance. The cerebral hemispheres demonstrate grossly normal gray-white differentiation. No mass effect or midline shift is seen. Vascular: No hyperdense vessel or unexpected calcification. Skull: There is no evidence of fracture; visualized osseous structures are unremarkable in appearance. Sinuses/Orbits: The orbits are within normal limits. The paranasal sinuses and mastoid air cells are well-aerated. Other: No significant soft tissue abnormalities are seen. IMPRESSION: 1. No acute intracranial pathology seen on CT. 2. Moderately severe cortical  volume loss and scattered small vessel ischemic microangiopathy. Electronically Signed   By: Roanna Raider M.D.   On: 03/29/2017 01:24   Ct Abdomen Pelvis W Contrast  Result Date: 03/29/2017 CLINICAL DATA:  Vomiting EXAM: CT ABDOMEN AND PELVIS WITH CONTRAST TECHNIQUE: Multidetector CT imaging of the abdomen and pelvis was performed using the standard protocol following bolus administration of intravenous contrast. CONTRAST:  45mL ISOVUE-300 IOPAMIDOL (ISOVUE-300) INJECTION 61% COMPARISON:  03/29/2017, CT 09/25/2007 FINDINGS: Lower chest: Lung bases demonstrate atelectasis or scar in the right base. Calcified granuloma in the left lower lobe. Trace left pleural effusion. Mild coronary calcification. Normal heart size. Hepatobiliary: No focal liver abnormality is seen. Status post cholecystectomy. No biliary dilatation. Pancreas: Unremarkable. No pancreatic ductal dilatation or surrounding inflammatory changes. Spleen: Normal in size without focal abnormality. Adrenals/Urinary Tract: Adrenal glands are within  normal limits. Cysts within the bilateral kidneys. Negative for hydronephrosis. No significant excretion on delayed images. Bladder thick-walled. Possible faint calcifications along the posterior bladder wall. Stomach/Bowel: Stomach is nonenlarged. Fluid-filled distal esophagus with mild thickening at the GE junction. No dilated small bowel. No colon wall thickening. Sigmoid colon diverticular disease without acute inflammation. Vascular/Lymphatic: Aortic atherosclerosis. No aneurysmal dilatation. No significantly enlarged lymph nodes. Reproductive: Mild prostate calcification Other: Negative for free air or significant free fluid. Large left abdominal wall ventral hernia containing mesenteric fat and bowel. Wall defect measures at least 7.2 cm. Musculoskeletal: Degenerative changes of the spine. Grade 1 anterolisthesis of L4 on L5. Mild superior endplate compression at L1. IMPRESSION: 1. No CT evidence for  bowel obstruction or significant bowel wall thickening 2. Large left abdominal ventral hernia containing mesentery and small bowel, no evidence for incarceration or obstruction at this time. 3. Trace left pleural effusion 4. Thick-walled appearance of the urinary bladder with possible faint posterior bladder wall calcification, correlate clinically to exclude a cystitis Electronically Signed   By: Jasmine Pang M.D.   On: 03/29/2017 02:55   Dg Chest Port 1 View  Result Date: 03/29/2017 CLINICAL DATA:  Sepsis, vomiting EXAM: PORTABLE CHEST 1 VIEW COMPARISON:  08/23/2016 FINDINGS: Low lung volumes with bibasilar atelectasis. No pleural effusion. Stable cardiomediastinal silhouette. IMPRESSION: Low lung volumes with mild bibasilar atelectasis Electronically Signed   By: Jasmine Pang M.D.   On: 03/29/2017 00:41   Dg Abd Portable 1 View  Result Date: 03/29/2017 CLINICAL DATA:  Vomiting EXAM: PORTABLE ABDOMEN - 1 VIEW COMPARISON:  08/22/2016 FINDINGS: Status post hernia repair. Nonobstructed bowel gas pattern with large amount of stool in the colon. No abnormal calcification. IMPRESSION: Nonobstructed gas pattern with large amount of stool in the colon Electronically Signed   By: Jasmine Pang M.D.   On: 03/29/2017 00:42    EKG: Independently reviewed. Sinus rhythm, PAC's, anterolateral T-wave flattening.   Assessment/Plan  1. Sepsis secondary to UTI  - Presents from SNF with N/V and hypotension, found to be febrile and tachycardic with leukocytosis, elevated lactate, and AKI  - CXR grossly clear, UA suggestive of infection, CT abd/pelvis with bladder-wall thickening  - Blood and urine cultures were collected in ED, 30 cc/kg NS was given, and he was started on empiric vancomycin and Zosyn  - Plan to continue IVF hydration, trend lactate and procalcitonin, continue empiric abx with cefepime given apparent urinary source, follow cultures and clinical course   2. Acute kidney injury  - SCr is 1.67 on  admission, up from 0.61 in January 2018 - No evidence for obstruction on CT  - Likely prerenal in setting of vomiting, clinical dehydration, and hypotension  - Continue fluid-resuscitation, avoid nephrotoxic agents where possible, avoid hypotension  - Repeat chem panel in am   3. Hyperglycemia  - Serum glucose is 237 on admission, likely from sepsis  - A1c in January '18 was only 5.5%  - Check CBG q4h and use a low-intensity SSI with Novolog    4. Dementia  - Continue Namenda   5. OSA  - CPAP qHS     DVT prophylaxis: sq heparin Code Status: Full  Family Communication: Discussed with patient Disposition Plan: Admit to SDU Consults called: None Admission status: Inpatient    Briscoe Deutscher, MD [facility] Pager [phone]  If 7PM-7AM, please contact night-coverage [email or web address] Password [facility]  03/29/2017, 3:33 AM

## 2017-03-30 LAB — URINE CULTURE

## 2017-03-30 LAB — COMPREHENSIVE METABOLIC PANEL
ALK PHOS: 70 U/L (ref 38–126)
ALT: 15 U/L — AB (ref 17–63)
AST: 20 U/L (ref 15–41)
Albumin: 2.8 g/dL — ABNORMAL LOW (ref 3.5–5.0)
Anion gap: 5 (ref 5–15)
BUN: 23 mg/dL — AB (ref 6–20)
CALCIUM: 8.6 mg/dL — AB (ref 8.9–10.3)
CHLORIDE: 115 mmol/L — AB (ref 101–111)
CO2: 27 mmol/L (ref 22–32)
CREATININE: 0.74 mg/dL (ref 0.61–1.24)
GFR calc Af Amer: >60 mL/min (ref >60)
GFR calc non Af Amer: >60 mL/min (ref >60)
Glucose, Bld: 105 mg/dL — ABNORMAL HIGH (ref 65–99)
Potassium: 3.6 mmol/L (ref 3.5–5.1)
SODIUM: 147 mmol/L — AB (ref 135–145)
Total Bilirubin: 0.9 mg/dL (ref 0.3–1.2)
Total Protein: 5.6 g/dL — ABNORMAL LOW (ref 6.5–8.1)

## 2017-03-30 LAB — GLUCOSE, CAPILLARY
GLUCOSE-CAPILLARY: 95 mg/dL (ref 65–99)
GLUCOSE-CAPILLARY: 109 mg/dL — AB (ref 65–99)
Glucose-Capillary: 84 mg/dL (ref 65–99)
Glucose-Capillary: 97 mg/dL (ref 65–99)

## 2017-03-30 LAB — CBC WITH DIFFERENTIAL/PLATELET
Basophils Absolute: 0 10*3/uL (ref 0.0–0.1)
Basophils Relative: 0 %
Eosinophils Absolute: 0.5 10*3/uL (ref 0.0–0.7)
Eosinophils Relative: 5 %
HCT: 38.7 % — ABNORMAL LOW (ref 39.0–52.0)
Hemoglobin: 12.8 g/dL — ABNORMAL LOW (ref 13.0–17.0)
Lymphocytes Relative: 12 %
Lymphs Abs: 1.4 10*3/uL (ref 0.7–4.0)
MCH: 28.8 pg (ref 26.0–34.0)
MCHC: 33.1 g/dL (ref 30.0–36.0)
MCV: 87 fL (ref 78.0–100.0)
Monocytes Absolute: 0.6 10*3/uL (ref 0.1–1.0)
Monocytes Relative: 5 %
Neutro Abs: 8.9 10*3/uL — ABNORMAL HIGH (ref 1.7–7.7)
Neutrophils Relative %: 78 %
Platelets: 187 10*3/uL (ref 150–400)
RBC: 4.45 MIL/uL (ref 4.22–5.81)
RDW: 15.5 % (ref 11.5–15.5)
WBC: 11.4 10*3/uL — ABNORMAL HIGH (ref 4.0–10.5)

## 2017-03-30 LAB — LACTIC ACID, PLASMA: Lactic Acid, Venous: 1.1 mmol/L (ref 0.5–1.9)

## 2017-03-30 MED ORDER — DEXTROSE 5 % IV SOLN
2.0000 g | Freq: Two times a day (BID) | INTRAVENOUS | Status: DC
  Filled 2017-03-30: qty 2

## 2017-03-30 MED ORDER — DEXTROSE 5 % IV SOLN
1.0000 g | INTRAVENOUS | Status: DC
  Administered 2017-03-30: 1 g via INTRAVENOUS
  Filled 2017-03-30 (×2): qty 10

## 2017-03-30 NOTE — H&P (Signed)
Pt declined cpap

## 2017-03-30 NOTE — Care Management Note (Signed)
Case Management Note  Patient Details  Name: GEARL ALSOBROOKS MRN: [id number] Date of Birth: [birth year]  Subjective/Objective:    Sepsis                 Action/Plan: Date:  March 30, 2017 Chart reviewed for concurrent status and case management needs. Will continue to follow patient progress. Discharge Planning: following for needs Expected discharge date: [id number] Marcelle Smiling, BSN, [place], [place]   [phone]  Expected Discharge Date:                  Expected Discharge Plan:  Skilled Nursing Facility  In-House Referral:  Clinical Social Work  Discharge planning Services  CM Consult  Post Acute Care Choice:    Choice offered to:     DME Arranged:    DME Agency:     HH Arranged:    HH Agency:     Status of Service:  In process, will continue to follow  If discussed at Long Length of Stay Meetings, dates discussed:    Additional Comments:  Golda Acre, RN 03/30/2017, 8:49 AM

## 2017-03-30 NOTE — Progress Notes (Signed)
Pharmacy Antibiotic Note  Nicholas Herring is a 83 y.o. male admitted on 03/28/2017 with sepsis and UTI.  Pharmacy has been consulted for Cefepime dosing.  Plan: Change Cefepime from 2g IV q24hr to 2g IV q12 for improving renal function  Height: 6' 0.05" (183 cm) Weight: 170 lb (77.1 kg) IBW/kg (Calculated) : 77.71  Temp (24hrs), Avg:98 F (36.7 C), Min:97.8 F (36.6 C), Max:98.5 F (36.9 C)   Recent Labs Lab 03/28/17 2356 03/29/17 0015 03/29/17 0016 03/29/17 0332 03/29/17 8921 03/29/17 0947 03/30/17 0840  WBC 13.1*  --   --   --  16.3*  --  11.4*  CREATININE 1.67* 1.60*  --   --  1.39*  --  0.74  LATICACIDVEN  --   --  6.15* 6.15* 4.2* 2.9* 1.1    Estimated Creatinine Clearance: 69.6 mL/min (by C-G formula based on SCr of 0.74 mg/dL).    Allergies  Allergen Reactions  . Nsaids Other (See Comments)    Ulceration   . Codeine Other (See Comments)    Unknown reaction  . Morphine And Related Nausea And Vomiting    Antimicrobials this admission: Vancomycin 03/29/2017 x1 Zosyn 03/29/2017 x1 Cefepime 03/29/2017 >>   Dose adjustments this admission: -  Microbiology results: pending  Thank you for allowing pharmacy to be a part of this patient's care.   Hessie Knows, PharmD, BCPS Pager [phone] 03/30/2017 12:14 PM

## 2017-03-30 NOTE — Clinical Social Work Note (Signed)
Clinical Social Work Assessment  Patient Details  Name: Nicholas Herring MRN: [id number] Date of Birth: [birth year]  Date of referral:  03/30/17               Reason for consult:   (pt from facility)                Permission sought to share information with:  Facility Medical sales representative, Family Supports Permission granted to share information::  Yes, Verbal Permission Granted  Name::     daughters Nicholas Herring, Nicholas Herring  Agency::  [facility]  Relationship::     Contact Information:     Housing/Transportation Living arrangements for the past 2 months:  Skilled Building surveyor of Information:  Adult Children, Facility Patient Interpreter Needed:  None Criminal Activity/Legal Involvement Pertinent to Current Situation/Hospitalization:  No - Comment as needed Significant Relationships:  Merchandiser, retail, Friend, Adult Children Lives with:  Facility Resident Do you feel safe going back to the place where you live?  Yes Need for family participation in patient care:  Yes (Comment) (pt with dementia- children primary decision makers)  Care giving concerns:  Pt from [facility] where he is a long term care resident. At baseline ambulates with a walker with assistance and does requires assistance with feeding and ADLs. Per daughter and facility, no caregiving concerns. "He has been there a few months and had trouble adjusting at first but now has made friends and is really happy there." Pt does have dementia but is not grossly confused at baseline per daughter.   Social Worker assessment / plan:  CSW following as pt is admitted from facility- [facility].  Plan per daughter is to return at DC. Facility confirms pt can return when stable. Updated FL2 and facility selected in the HUB.  Plan: return to [facility] at [place].  Employment status:  Retired Database administrator PT Recommendations:  Skilled Nursing Facility Information / Referral to community  resources:     Patient/Family's Response to care:  Engaged and appreciative of care.   Patient/Family's Understanding of and Emotional Response to Diagnosis, Current Treatment, and Prognosis:  Demonstrates understanding of place and situation and that plan is to return. Daughter also understanding and positive about pt's prognosis and treatment.   Emotional Assessment Appearance:  Appears stated age Attitude/Demeanor/Rapport:   (appropriate) Affect (typically observed):  Calm Orientation:  Oriented to Self, Oriented to Place, Oriented to Situation Alcohol / Substance use:  Not Applicable Psych involvement (Current and /or in the community):  No (Comment)  Discharge Needs  Concerns to be addressed:  Discharge Planning Concerns Readmission within the last 30 days:  No Current discharge risk:  None Barriers to Discharge:  Continued Medical Work up   [employer], LCSW 03/30/2017, 2:13 PM  [phone]

## 2017-03-30 NOTE — Progress Notes (Signed)
[facility] Progress Note  Patient: Nicholas Herring   PCP: Jonita Albee, MD DOB: 11/29/1926   DOA: 03/28/2017   DOS: 03/30/2017   Date of Service: the patient was seen and examined on 03/30/2017  Subjective: feeling better. No acute No acute abdominal pain. No nausea no vomiting. No burning urination. No diarrhea no constipation. No fever since admission.  Brief hospital course: Pt. with PMH of dementia, BPH, osteoarthritis, OSA, ventral hernia; admitted on 03/28/2017, presented with complaint of nausea vomiting as well as hypotension, was found to have sepsis due to UTI. Currently further plan is continue IV antibiotics.  Assessment and Plan: 1. Sepsis due to UTI. Metabolic acidosis Acute kidney injury  Patient was febrile tachycardic, presented with leukocytosis as well as lactic acidosis and hypotension. Meeting sepsis criteria on admission. Chest x-ray was unremarkable for any pneumonia. CT of the abdomen was negative for any acute abnormality. Patient has large ventral hernia without any obstruction or incarceration. Possible cystitis on the CT scan. Had severe lactic acidosis on presentation likely from dehydration, currently getting better. Continue gentle IV hydration. Changing IV antibiotics from IV cefepime to IV ceftriaxone. Not at risk for Pseudomonas. MRSA PCR negative. Blood culture negative for 24 hours. Urine culture grew multiple species. Leukocytosis is getting better. Monitor culture clearance for 48 hours before transitioning to by mouth.  2. Acute kidney injury. 6 renal function significantly improved with IV hydration. Continue gentle IV hydration for now. Monitor daily BMP for now. Avoid nephrotoxic medication.  3. Dementia. Continue Namenda. When necessary Haldol. Had some sundowning yesterday currently better. We'll monitor.  4. Obstructive sleep apnea. Continue CPAP every night.  Diet: cardiac diet DVT Prophylaxis: subcutaneous  Heparin  Advance goals of care discussion: full code  Family Communication: no family was present at bedside, at the time of interview.   Disposition:  Discharge to SNF likely tomorrow.  Consultants: none Procedures: none  Antibiotics: Anti-infectives    Start     Dose/Rate Route Frequency Ordered Stop   03/30/17 2200  ceFEPIme (MAXIPIME) 2 g in dextrose 5 % 50 mL IVPB  Status:  Discontinued     2 g 100 mL/hr over 30 Minutes Intravenous Every 12 hours 03/30/17 1216 03/30/17 1349   03/30/17 2000  cefTRIAXone (ROCEPHIN) 1 g in dextrose 5 % 50 mL IVPB     1 g 100 mL/hr over 30 Minutes Intravenous Every 24 hours 03/30/17 1354     03/30/17 0600  ceFEPIme (MAXIPIME) 2 g in dextrose 5 % 50 mL IVPB  Status:  Discontinued     2 g 100 mL/hr over 30 Minutes Intravenous Every 24 hours 03/29/17 0458 03/30/17 1216   03/29/17 0345  ceFEPIme (MAXIPIME) 2 g in dextrose 5 % 50 mL IVPB     2 g 100 mL/hr over 30 Minutes Intravenous  Once 03/29/17 0333 03/29/17 0951   03/29/17 0015  piperacillin-tazobactam (ZOSYN) IVPB 3.375 g     3.375 g 100 mL/hr over 30 Minutes Intravenous  Once 03/29/17 0007 03/29/17 1000   03/29/17 0015  vancomycin (VANCOCIN) IVPB 1000 mg/200 mL premix     1,000 mg 200 mL/hr over 60 Minutes Intravenous  Once 03/29/17 0007 03/29/17 1000       Objective: Physical Exam: Vitals:   03/30/17 0811 03/30/17 1010 03/30/17 1145 03/30/17 1355  BP:  136/75  118/60  Pulse: 70 75 69 75  Resp: 16 14 (!) 22 20  Temp:    98.9 F (37.2 C)  TempSrc:    Oral  SpO2: 98% 99% 98% 94%  Weight:      Height:        Intake/Output Summary (Last 24 hours) at 03/30/17 1512 Last data filed at 03/30/17 1300  Gross per 24 hour  Intake              927 ml  Output              500 ml  Net              427 ml   Filed Weights   03/29/17 0028  Weight: 77.1 kg (170 lb)   General: Alert, Awake and Oriented to Time, Place and Person. Appear in mild distress, affect flat Eyes: PERRL,  Conjunctiva normal ENT: Oral Mucosa clear moist. Neck: no JVD, no Abnormal Mass Or lumps Cardiovascular: S1 and S2 Present, no Murmur, Peripheral Pulses Present Respiratory: normal respiratory effort, Bilateral Air entry equal and Decreased, no use of accessory muscle, Clear to Auscultation, no Crackles, no wheezes Abdomen: Bowel Sound present, Soft and no tenderness, no hernia Skin: no redness, no Rash, no induration Extremities: no Pedal edema, no calf tenderness Neurologic: Grossly no focal neuro deficit. Bilaterally Equal motor strength  Data Reviewed: CBC:  Recent Labs Lab 03/28/17 2356 03/29/17 0015 03/29/17 0638 03/30/17 0840  WBC 13.1*  --  16.3* 11.4*  NEUTROABS 10.7*  --  13.8* 8.9*  HGB 17.2* 17.3* 13.8 12.8*  HCT 49.8 51.0 42.1 38.7*  MCV 85.1  --  88.1 87.0  PLT 298  --  194 187   Basic Metabolic Panel:  Recent Labs Lab 03/28/17 2356 03/29/17 0015 03/29/17 0638 03/30/17 0840  NA 140 139 141 147*  K 4.3 4.1 4.2 3.6  CL 107 107 109 115*  CO2 17*  --  21* 27  GLUCOSE 237* 241* 137* 105*  BUN 30* 33* 34* 23*  CREATININE 1.67* 1.60* 1.39* 0.74  CALCIUM 9.2  --  8.0* 8.6*    Liver Function Tests:  Recent Labs Lab 03/28/17 2356 03/30/17 0840  AST 32 20  ALT 16* 15*  ALKPHOS 93 70  BILITOT 1.2 0.9  PROT 6.9 5.6*  ALBUMIN 3.5 2.8*   No results for input(s): LIPASE, AMYLASE in the last 168 hours. No results for input(s): AMMONIA in the last 168 hours. Coagulation Profile:  Recent Labs Lab 03/28/17 2356  INR 1.02   Cardiac Enzymes: No results for input(s): CKTOTAL, CKMB, CKMBINDEX, TROPONINI in the last 168 hours. BNP (last 3 results) No results for input(s): PROBNP in the last 8760 hours. CBG:  Recent Labs Lab 03/29/17 1517 03/29/17 1912 03/29/17 2302 03/30/17 0734 03/30/17 1220  GLUCAP 107* 98 108* 84 95   Studies: No results found.  Scheduled Meds: . cholecalciferol  1,000 Units Oral QPM  . docusate sodium  100 mg Oral BID  .  feeding supplement (ENSURE ENLIVE)  237 mL Oral BID BM  . heparin  5,000 Units Subcutaneous Q8H  . insulin aspart  0-9 Units Subcutaneous Q4H  . memantine  10 mg Oral QPM  . omega-3 acid ethyl esters  1 g Oral Daily  . sodium chloride flush  3 mL Intravenous Q12H   Continuous Infusions: . cefTRIAXone (ROCEPHIN)  IV     PRN Meds: acetaminophen **OR** acetaminophen, bisacodyl, calamine, fentaNYL (SUBLIMAZE) injection, haloperidol lactate, HYDROcodone-acetaminophen, loratadine, ondansetron **OR** ondansetron (ZOFRAN) IV, polyethylene glycol  Time spent: 35 minutes  Author: Lynden Oxford, MD [facility] Pager: [phone] 03/30/2017 3:12  PM  If 7PM-7AM, please contact night-coverage at [email or web address], password [facility]

## 2017-03-30 NOTE — Progress Notes (Signed)
Transferred to room 1331 via bed. Report given to RN.

## 2017-03-30 NOTE — NC FL2 (Signed)
[place] MEDICAID FL2 LEVEL OF CARE SCREENING TOOL     IDENTIFICATION  Patient Name: Nicholas Herring Birthdate: 10-Jul-1927 Sex: male Admission Date (Current Location): 03/28/2017  [facility] and [place] Number:  Producer, television/film/video and Address:  [facility],  [address] [place], [place] [address]      Provider Number: [id number]  Attending Physician Name and Address:  Rolly Salter, MD  Relative Name and Phone Number:       Current Level of Care: Hospital Recommended Level of Care: Skilled Nursing Facility Prior Approval Number:    Date Approved/Denied:   PASRR Number: [id number]  Discharge Plan: SNF    Current Diagnoses: Patient Active Problem List   Diagnosis Date Noted  . Sepsis secondary to UTI (HCC) 03/29/2017  . AKI (acute kidney injury) (HCC) 03/29/2017  . Dementia 03/29/2017  . Acute UTI (urinary tract infection)   . Severe sepsis (HCC)   . Influenza-like illness 08/22/2016  . Sleep apnea 08/22/2016  . Nausea & vomiting 08/22/2016  . CAP (community acquired pneumonia) 08/22/2016  . Acute respiratory failure with hypoxia (HCC) 08/22/2016  . Hyperglycemia 08/22/2016  . Acute esophagitis 08/27/2013  . Melena 08/26/2013  . Hematemesis 08/25/2013  . Ileus, postoperative (HCC) 08/25/2013  . Hiatal hernia 08/17/2013  . Osteoarthritis of both knees 08/17/2013  . Recurrent bilateral inguinal hernia (BIH) s/p lap repair w mesh 08/23/2013 08/17/2013  . Difficulty in urination     Orientation RESPIRATION BLADDER Height & Weight     Self, Situation, Place  Normal Incontinent, External catheter Weight: 170 lb (77.1 kg) Height:  6' 0.05" (183 cm)  BEHAVIORAL SYMPTOMS/MOOD NEUROLOGICAL BOWEL NUTRITION STATUS      Continent Diet (regular diet, thin fluid consistency)  AMBULATORY STATUS COMMUNICATION OF NEEDS Skin   Extensive Assist Verbally Normal                       Personal Care Assistance Level of Assistance  Bathing, Feeding, Dressing  Bathing Assistance: Maximum assistance Feeding assistance: Limited assistance Dressing Assistance: Limited assistance     Functional Limitations Info  Sight, Hearing, Speech Sight Info: Impaired Hearing Info: Adequate Speech Info: Adequate    SPECIAL CARE FACTORS FREQUENCY  PT (By licensed PT), OT (By licensed OT)     PT Frequency: 5x OT Frequency: 5x            Contractures Contractures Info: Not present    Additional Factors Info  Code Status, Allergies Code Status Info: full code Allergies Info: Nsaids, Codeine, Morphine And Related           Current Medications (03/30/2017):  This is the current hospital active medication list Current Facility-Administered Medications  Medication Dose Route Frequency Provider Last Rate Last Dose  . acetaminophen (TYLENOL) tablet 650 mg  650 mg Oral Q6H PRN Opyd, Lavone Neri, MD   650 mg at 03/30/17 2703   Or  . acetaminophen (TYLENOL) suppository 650 mg  650 mg Rectal Q6H PRN Opyd, Lavone Neri, MD      . bisacodyl (DULCOLAX) EC tablet 5 mg  5 mg Oral Daily PRN Opyd, Lavone Neri, MD      . calamine lotion   Topical PRN Rolly Salter, MD      . cefTRIAXone (ROCEPHIN) 1 g in dextrose 5 % 50 mL IVPB  1 g Intravenous Q24H Hessie Knows, [facility] [employer]      . cholecalciferol (VITAMIN D) tablet 1,000 Units  1,000 Units Oral QPM  Briscoe Deutscher, MD   1,000 Units at 03/29/17 1703  . docusate sodium (COLACE) capsule 100 mg  100 mg Oral BID Briscoe Deutscher, MD   100 mg at 03/29/17 2106  . feeding supplement (ENSURE ENLIVE) (ENSURE ENLIVE) liquid 237 mL  237 mL Oral BID BM Opyd, Lavone Neri, MD   237 mL at 03/30/17 1000  . fentaNYL (SUBLIMAZE) injection 25-50 mcg  25-50 mcg Intravenous Q2H PRN Opyd, Lavone Neri, MD      . haloperidol lactate (HALDOL) injection 1 mg  1 mg Intravenous Q6H PRN Rolly Salter, MD   1 mg at 03/30/17 0442  . heparin injection 5,000 Units  5,000 Units Subcutaneous Q8H Briscoe Deutscher, MD   5,000 Units at 03/30/17 [phone]  .  HYDROcodone-acetaminophen (NORCO/VICODIN) 5-325 MG per tablet 1-2 tablet  1-2 tablet Oral Q4H PRN Opyd, Lavone Neri, MD      . insulin aspart (novoLOG) injection 0-9 Units  0-9 Units Subcutaneous Q4H Opyd, Lavone Neri, MD      . loratadine (CLARITIN) tablet 10 mg  10 mg Oral Daily PRN Opyd, Lavone Neri, MD      . memantine (NAMENDA) tablet 10 mg  10 mg Oral QPM Opyd, Lavone Neri, MD   10 mg at 03/29/17 1703  . omega-3 acid ethyl esters (LOVAZA) capsule 1 g  1 g Oral Daily Opyd, Lavone Neri, MD   1 g at 03/30/17 1004  . ondansetron (ZOFRAN) tablet 4 mg  4 mg Oral Q6H PRN Opyd, Lavone Neri, MD       Or  . ondansetron (ZOFRAN) injection 4 mg  4 mg Intravenous Q6H PRN Opyd, Lavone Neri, MD      . polyethylene glycol (MIRALAX / GLYCOLAX) packet 17 g  17 g Oral Daily PRN Opyd, Lavone Neri, MD      . sodium chloride flush (NS) 0.9 % injection 3 mL  3 mL Intravenous Q12H Opyd, Lavone Neri, MD   3 mL at 03/30/17 1000     Discharge Medications: Please see discharge summary for a list of discharge medications.  Relevant Imaging Results:  Relevant Lab Results:   Additional Information ssn:[id number]  Nelwyn Salisbury, LCSW

## 2017-03-30 NOTE — Progress Notes (Signed)
Physical Therapy Evaluation Patient Details Name: Nicholas Herring MRN: [id number] DOB: 06/26/1927 Today's Date: 03/30/2017   History of Present Illness  Pt is a 83 y.o. male with PMHx for dementia, BPH, osteoarthritis, obstructive sleep apnea, and incarcerated incisional hernia. Pt presented to the emergency department from his SNF for evaluation of nausea with vomiting and low blood pressures. Pt was admitted to with sepsis likely caused by UTI.  Clinical Impression  Pt admitted with the conditions listed above. Pt currently with functional limitations due to the deficits listed below. Pt will benefit from skilled PT to increase their independence and safety with mobility to allow for discharge. Pt presently confused and a bad historian. Pt able to sit upright at EOB but unable to transfer as pain increased in bilat knees. Pt presents with flexion contractures at approx 45 degrees in bilat knees.     Follow Up Recommendations SNF    Equipment Recommendations  None recommended by PT    Recommendations for Other Services       Precautions / Restrictions Precautions Precautions: Fall Restrictions Weight Bearing Restrictions: No      Mobility  Bed Mobility Overal bed mobility: Needs Assistance Bed Mobility: Supine to Sit;Sit to Supine     Supine to sit: HOB elevated;Min assist Sit to supine: Mod assist   General bed mobility comments: Min assist provided to steady trunk upon sitting at EOB, increased time to sit but pt able to manage LE off of bed, pt requires mod assist to manage LE back  back into bed, pt also requires assist to reposition in bed  Transfers                 General transfer comment: Not attempted at this time due to pain in bilat knees  Ambulation/Gait             General Gait Details: Not attempted at this time   Stairs            Wheelchair Mobility    Modified Rankin (Stroke Patients Only)       Balance Overall balance  assessment: Needs assistance Sitting-balance support: Feet supported Sitting balance-Leahy Scale: Fair Sitting balance - Comments: Pt able to maintain upright posture with feet supported while sitting at EOB                                      Pertinent Vitals/Pain Pain Assessment: Faces Faces Pain Scale: Hurts little more Pain Location: Bilat Knees Pain Descriptors / Indicators: Grimacing;Sore Pain Intervention(s): Limited activity within patient's tolerance;Monitored during session;Repositioned    Home Living Family/patient expects to be discharged to:: Skilled nursing facility                      Prior Function Level of Independence: Needs assistance         Comments: Pt currently at SNF, pt is presently confused with no caregiver or family present to account for prior level of functioning      Hand Dominance        Extremity/Trunk Assessment   Upper Extremity Assessment Upper Extremity Assessment: RUE deficits/detail RUE Deficits / Details: R shoulder ROM limited in flexion and abduction, pt reports it is from a previous fx which is undocumented in hx     Lower Extremity Assessment Lower Extremity Assessment: LLE deficits/detail;RLE deficits/detail;Generalized weakness (Pt c/o pain in bilat knees due  to osteoarthritis, pt with flexion contractures at approx 45 degrees )       Communication   Communication: No difficulties  Cognition Arousal/Alertness: Awake/alert Behavior During Therapy: WFL for tasks assessed/performed Overall Cognitive Status: No family/caregiver present to determine baseline cognitive functioning                                 General Comments: Unable to determine baseline as pt has hx of dementia, pt is presently confused       General Comments      Exercises     Assessment/Plan    PT Assessment Patient needs continued PT services  PT Problem List Decreased mobility;Decreased safety  awareness;Decreased range of motion;Decreased activity tolerance;Decreased balance;Decreased knowledge of use of DME;Decreased strength;Decreased cognition       PT Treatment Interventions DME instruction;Therapeutic activities;Gait training;Therapeutic exercise;Patient/family education;Balance training;Functional mobility training;Wheelchair mobility training    PT Goals (Current goals can be found in the Care Plan section)  Acute Rehab PT Goals Patient Stated Goal: Pt to return to baseline function PT Goal Formulation: With patient Time For Goal Achievement: 04/13/18 Potential to Achieve Goals: Fair    Frequency Min 2X/week   Barriers to discharge        Co-evaluation               AM-PAC PT "6 Clicks" Daily Activity  Outcome Measure Difficulty turning over in bed (including adjusting bedclothes, sheets and blankets)?: A Little Difficulty moving from lying on back to sitting on the side of the bed? : A Lot Difficulty sitting down on and standing up from a chair with arms (e.g., wheelchair, bedside commode, etc,.)?: Unable Help needed moving to and from a bed to chair (including a wheelchair)?: Total Help needed walking in hospital room?: Total Help needed climbing 3-5 steps with a railing? : Total 6 Click Score: 9    End of Session   Activity Tolerance: Patient tolerated treatment well Patient left: in bed;with bed alarm set;with call bell/phone within reach Nurse Communication: Mobility status PT Visit Diagnosis: Other abnormalities of gait and mobility (R26.89)    Time: 9147-8295 PT Time Calculation (min) (ACUTE ONLY): 25 min   Charges:   PT Evaluation $PT Eval Low Complexity: 1 Low     PT G CodesMarlene Herring, SPT   Nicholas Herring 03/30/2017, 1:04 PM

## 2017-03-31 LAB — GLUCOSE, CAPILLARY
GLUCOSE-CAPILLARY: 102 mg/dL — AB (ref 65–99)
GLUCOSE-CAPILLARY: 86 mg/dL (ref 65–99)
Glucose-Capillary: 92 mg/dL (ref 65–99)
Glucose-Capillary: 97 mg/dL (ref 65–99)

## 2017-03-31 LAB — BASIC METABOLIC PANEL
Anion gap: 6 (ref 5–15)
BUN: 16 mg/dL (ref 6–20)
CHLORIDE: 107 mmol/L (ref 101–111)
CO2: 26 mmol/L (ref 22–32)
CREATININE: 0.59 mg/dL — AB (ref 0.61–1.24)
Calcium: 8.3 mg/dL — ABNORMAL LOW (ref 8.9–10.3)
GFR calc Af Amer: >60 mL/min (ref >60)
GFR calc non Af Amer: >60 mL/min (ref >60)
Glucose, Bld: 100 mg/dL — ABNORMAL HIGH (ref 65–99)
POTASSIUM: 3.8 mmol/L (ref 3.5–5.1)
SODIUM: 139 mmol/L (ref 135–145)

## 2017-03-31 MED ORDER — CEPHALEXIN 500 MG PO CAPS: 500.0000 mg | ORAL_CAPSULE | Freq: Two times a day (BID) | ORAL | 0 refills | Status: AC

## 2017-03-31 MED ORDER — CALAMINE EX LOTN: TOPICAL_LOTION | CUTANEOUS | 0 refills | Status: DC | PRN

## 2017-03-31 MED ORDER — DOCUSATE SODIUM 100 MG PO CAPS: 100.0000 mg | ORAL_CAPSULE | Freq: Two times a day (BID) | ORAL | 0 refills | Status: AC

## 2017-03-31 NOTE — Progress Notes (Signed)
Pt from [facility] SNF long term care resident, room 601-B, and will return at DC today. Report # for RN: [phone] Spoke with pt's daughters Nedra Hai and Idalia Needle- they are aware and agreeable to plan. [facility] transport requested due to pt's confusion and difficulty ambulating per family. CSW completed medical necessity form and will arrange transportation.  All information provided to facility via the HUB.   Ilean Skill, MSW, LCSW Clinical Social Work 03/31/2017 [phone]

## 2017-03-31 NOTE — Discharge Summary (Signed)
[facility] Discharge Summary   Patient: Nicholas Herring ZOX:[id number]   PCP: Jonita Albee, MD DOB: October 04, 1926   Date of admission: 03/28/2017   Date of discharge:  03/31/2017    Discharge Diagnoses:  Principal Problem:   Sepsis secondary to UTI [facility]) Active Problems:   Sleep apnea   Nausea & vomiting   Hyperglycemia   AKI (acute kidney injury) (HCC)   Dementia   Admitted From: SNF Disposition:  SNF  Recommendations for Outpatient Follow-up:  1. Please follow up with PCP in 1 week    Contact information for follow-up providers    Guest, Ashley Jacobs, MD. Schedule an appointment as soon as possible for a visit in 1 week(s).   Specialty:  Internal Medicine           Contact information for after-discharge care    Destination    [facility] Follow up.   Specialty:  Skilled Nursing Facility Contact information: [address] [place] [address] [phone]                 Diet recommendation: regular diet  Activity: The patient is advised to gradually reintroduce usual activities.  Discharge Condition: good  Code Status: full code  History of present illness: As per the H and P dictated on admission, "Nicholas Herring is a 83 y.o. male with medical history significant for dementia, BPH, osteoarthritis, obstructive sleep apnea, and incarcerated incisional hernia, now presenting to the emergency department from his SNF for evaluation of nausea with vomiting and low blood pressures. Patient was noted by SNF personnel to have 4 episodes of vomiting tonight. His blood pressure was also noted to be on the low side and EMS was called out for transport to the hospital. Patient did not have any specific complaints. He was reportedly afebrile at the nursing facility. No diarrhea reported. He was treated with Phenergan 2 prior to transport to the ED.  "  Hospital Course:  Summary of his active problems in the hospital is as following. 1.  Sepsis due to UTI. Lactic acidosis, Acute kidney injury  Patient was febrile tachycardic, presented with leukocytosis as well as lactic acidosis and hypotension. Meeting sepsis criteria on admission. Chest x-ray was unremarkable for any pneumonia. CT of the abdomen was negative for any acute abnormality. Patient has large ventral hernia without any obstruction or incarceration. Possible cystitis on the CT scan. Had severe lactic acidosis on presentation likely from dehydration, currently getting better. Continue gentle IV hydration. Initially was on IV cefepime to IV ceftriaxone. Not at risk for Pseudomonas. MRSA PCR negative. Change to oral keflex and finish 7 day treatment course.  Blood culture negative for 24 hours. Urine culture grew multiple species. Leukocytosis is getting better.  2. Acute kidney injury. renal function significantly improved with IV hydration. Was given IV hydration, stop for now. Avoid nephrotoxic medication.  3. Dementia. Continue Namenda Had some sundowning, currently better  4. Obstructive sleep apnea. Continue CPAP every night.  All other chronic medical condition were stable during the hospitalization.  Patient was seen by physical therapy, who recommended SNF, which was arranged by Child psychotherapist and case Production designer, theatre/television/film. On the day of the discharge the patient's vitals were stable, and no other acute medical condition were reported by patient. the patient was felt safe to be discharge at SNF with therapy.  Procedures and Results:  none   Consultations:  none  DISCHARGE MEDICATION: Current Discharge Medication List    START taking  these medications   Details  calamine lotion Apply topically as needed for itching. Qty: 120 mL, Refills: 0    cephALEXin (KEFLEX) 500 MG capsule Take 1 capsule (500 mg total) by mouth 2 (two) times daily. Qty: 6 capsule, Refills: 0    docusate sodium (COLACE) 100 MG capsule Take 1 capsule (100 mg total) by mouth 2  (two) times daily. Qty: 10 capsule, Refills: 0      CONTINUE these medications which have NOT CHANGED   Details  acetaminophen (TYLENOL) 500 MG tablet Take 500 mg by mouth daily. May also take q6hprn    cetirizine (ZYRTEC) 10 MG tablet Take 1 tablet (10 mg total) by mouth at bedtime as needed for allergies. Qty: 30 tablet, Refills: 0    Cholecalciferol (VITAMIN D-3) 1000 units CAPS Take 1,000 Units by mouth every evening.    feeding supplement, ENSURE ENLIVE, (ENSURE ENLIVE) LIQD Take 237 mLs by mouth 2 (two) times daily between meals. Qty: 237 mL, Refills: 12    memantine (NAMENDA) 10 MG tablet Take 10 mg by mouth every evening.     Omega-3 Fatty Acids (FISH OIL) 1200 MG CAPS Take 1,200 mg by mouth every evening.       Allergies  Allergen Reactions  . Nsaids Other (See Comments)    Ulceration   . Codeine Other (See Comments)    Unknown reaction  . Morphine And Related Nausea And Vomiting   Discharge Instructions    Diet - low sodium heart healthy    Complete by:  As directed    Increase activity slowly    Complete by:  As directed      Discharge Exam: Filed Weights   03/29/17 0028  Weight: 77.1 kg (170 lb)   Vitals:   03/30/17 2149 03/31/17 0512  BP: 119/67 (!) 150/76  Pulse: 77 76  Resp: 18 16  Temp: 98.2 F (36.8 C) 98 F (36.7 C)  SpO2: 94% 92%   General: Appear in no distress, no Rash; Oral Mucosa moist. Cardiovascular: S1 and S2 Present, no Murmur, no JVD Respiratory: Bilateral Air entry present and Clear to Auscultation, no Crackles, no wheezes Abdomen: Bowel Sound present, Soft and no tenderness, large ventral hernia Extremities: no Pedal edema, no calf tenderness Neurology: Grossly no focal neuro deficit.  The results of significant diagnostics from this hospitalization (including imaging, microbiology, ancillary and laboratory) are listed below for reference.    Significant Diagnostic Studies: Ct Head Wo Contrast  Result Date:  03/29/2017 CLINICAL DATA:  Acute onset of vomiting.  Initial encounter. EXAM: CT HEAD WITHOUT CONTRAST TECHNIQUE: Contiguous axial images were obtained from the base of the skull through the vertex without intravenous contrast. COMPARISON:  CT of the head performed 07/19/2009 FINDINGS: Brain: No evidence of acute infarction, hemorrhage, hydrocephalus, extra-axial collection or mass lesion/mass effect. Prominence of the ventricles and sulci reflects moderately severe cortical volume loss. Scattered periventricular and subcortical matter change likely reflects small vessel ischemic microangiopathy. Cerebellar atrophy is noted. The brainstem and fourth ventricle are within normal limits. The basal ganglia are unremarkable in appearance. The cerebral hemispheres demonstrate grossly normal gray-white differentiation. No mass effect or midline shift is seen. Vascular: No hyperdense vessel or unexpected calcification. Skull: There is no evidence of fracture; visualized osseous structures are unremarkable in appearance. Sinuses/Orbits: The orbits are within normal limits. The paranasal sinuses and mastoid air cells are well-aerated. Other: No significant soft tissue abnormalities are seen. IMPRESSION: 1. No acute intracranial pathology seen on CT. 2. Moderately  severe cortical volume loss and scattered small vessel ischemic microangiopathy. Electronically Signed   By: Roanna Raider M.D.   On: 03/29/2017 01:24   Ct Abdomen Pelvis W Contrast  Result Date: 03/29/2017 CLINICAL DATA:  Vomiting EXAM: CT ABDOMEN AND PELVIS WITH CONTRAST TECHNIQUE: Multidetector CT imaging of the abdomen and pelvis was performed using the standard protocol following bolus administration of intravenous contrast. CONTRAST:  55mL ISOVUE-300 IOPAMIDOL (ISOVUE-300) INJECTION 61% COMPARISON:  03/29/2017, CT 09/25/2007 FINDINGS: Lower chest: Lung bases demonstrate atelectasis or scar in the right base. Calcified granuloma in the left lower lobe.  Trace left pleural effusion. Mild coronary calcification. Normal heart size. Hepatobiliary: No focal liver abnormality is seen. Status post cholecystectomy. No biliary dilatation. Pancreas: Unremarkable. No pancreatic ductal dilatation or surrounding inflammatory changes. Spleen: Normal in size without focal abnormality. Adrenals/Urinary Tract: Adrenal glands are within normal limits. Cysts within the bilateral kidneys. Negative for hydronephrosis. No significant excretion on delayed images. Bladder thick-walled. Possible faint calcifications along the posterior bladder wall. Stomach/Bowel: Stomach is nonenlarged. Fluid-filled distal esophagus with mild thickening at the GE junction. No dilated small bowel. No colon wall thickening. Sigmoid colon diverticular disease without acute inflammation. Vascular/Lymphatic: Aortic atherosclerosis. No aneurysmal dilatation. No significantly enlarged lymph nodes. Reproductive: Mild prostate calcification Other: Negative for free air or significant free fluid. Large left abdominal wall ventral hernia containing mesenteric fat and bowel. Wall defect measures at least 7.2 cm. Musculoskeletal: Degenerative changes of the spine. Grade 1 anterolisthesis of L4 on L5. Mild superior endplate compression at L1. IMPRESSION: 1. No CT evidence for bowel obstruction or significant bowel wall thickening 2. Large left abdominal ventral hernia containing mesentery and small bowel, no evidence for incarceration or obstruction at this time. 3. Trace left pleural effusion 4. Thick-walled appearance of the urinary bladder with possible faint posterior bladder wall calcification, correlate clinically to exclude a cystitis Electronically Signed   By: Jasmine Pang M.D.   On: 03/29/2017 02:55   Dg Chest Port 1 View  Result Date: 03/29/2017 CLINICAL DATA:  Sepsis, vomiting EXAM: PORTABLE CHEST 1 VIEW COMPARISON:  08/23/2016 FINDINGS: Low lung volumes with bibasilar atelectasis. No pleural effusion.  Stable cardiomediastinal silhouette. IMPRESSION: Low lung volumes with mild bibasilar atelectasis Electronically Signed   By: Jasmine Pang M.D.   On: 03/29/2017 00:41   Dg Abd Portable 1 View  Result Date: 03/29/2017 CLINICAL DATA:  Vomiting EXAM: PORTABLE ABDOMEN - 1 VIEW COMPARISON:  08/22/2016 FINDINGS: Status post hernia repair. Nonobstructed bowel gas pattern with large amount of stool in the colon. No abnormal calcification. IMPRESSION: Nonobstructed gas pattern with large amount of stool in the colon Electronically Signed   By: Jasmine Pang M.D.   On: 03/29/2017 00:42    Microbiology: Recent Results (from the past 240 hour(s))  Urine culture     Status: Abnormal   Collection Time: 03/28/17 11:58 PM  Result Value Ref Range Status   Specimen Description URINE, RANDOM  Final   Special Requests NONE  Final   Culture MULTIPLE SPECIES PRESENT, SUGGEST RECOLLECTION (A)  Final   Report Status 03/30/2017 FINAL  Final  Blood Culture (routine x 2)     Status: None (Preliminary result)   Collection Time: 03/29/17 12:01 AM  Result Value Ref Range Status   Specimen Description LEFT ANTECUBITAL  Final   Special Requests   Final    BOTTLES DRAWN AEROBIC AND ANAEROBIC Blood Culture adequate volume   Culture   Final    NO GROWTH 2 DAYS Performed  at [facility], [address] [place], [place] [address]    Report Status PENDING  Incomplete  Blood Culture (routine x 2)     Status: None (Preliminary result)   Collection Time: 03/29/17 12:29 AM  Result Value Ref Range Status   Specimen Description BLOOD BLOOD RIGHT HAND  Final   Special Requests IN PEDIATRIC BOTTLE Blood Culture adequate volume  Final   Culture   Final    NO GROWTH 2 DAYS Performed at [facility], [address], [place], [place] [address]    Report Status PENDING  Incomplete  MRSA PCR Screening     Status: None   Collection Time: 03/29/17  9:45 AM  Result Value Ref Range Status   MRSA by PCR NEGATIVE  NEGATIVE Final    Comment:        The GeneXpert MRSA Assay (FDA approved for NASAL specimens only), is one component of a comprehensive MRSA colonization surveillance program. It is not intended to diagnose MRSA infection nor to guide or monitor treatment for MRSA infections.      Labs: CBC:  Recent Labs Lab 03/28/17 2356 03/29/17 0015 03/29/17 0638 03/30/17 0840  WBC 13.1*  --  16.3* 11.4*  NEUTROABS 10.7*  --  13.8* 8.9*  HGB 17.2* 17.3* 13.8 12.8*  HCT 49.8 51.0 42.1 38.7*  MCV 85.1  --  88.1 87.0  PLT 298  --  194 187   Basic Metabolic Panel:  Recent Labs Lab 03/28/17 2356 03/29/17 0015 03/29/17 0638 03/30/17 0840 03/31/17 0934  NA 140 139 141 147* 139  K 4.3 4.1 4.2 3.6 3.8  CL 107 107 109 115* 107  CO2 17*  --  21* 27 26  GLUCOSE 237* 241* 137* 105* 100*  BUN 30* 33* 34* 23* 16  CREATININE 1.67* 1.60* 1.39* 0.74 0.59*  CALCIUM 9.2  --  8.0* 8.6* 8.3*   Liver Function Tests:  Recent Labs Lab 03/28/17 2356 03/30/17 0840  AST 32 20  ALT 16* 15*  ALKPHOS 93 70  BILITOT 1.2 0.9  PROT 6.9 5.6*  ALBUMIN 3.5 2.8*   No results for input(s): LIPASE, AMYLASE in the last 168 hours. No results for input(s): AMMONIA in the last 168 hours. Cardiac Enzymes: No results for input(s): CKTOTAL, CKMB, CKMBINDEX, TROPONINI in the last 168 hours. BNP (last 3 results) No results for input(s): BNP in the last 8760 hours. CBG:  Recent Labs Lab 03/30/17 1959 03/31/17 0010 03/31/17 0503 03/31/17 0737 03/31/17 1205  GLUCAP 109* 92 97 102* 86   Time spent: 35 minutes  Signed:  PATEL, PRANAV  [facility]  03/31/2017  , 1:02 PM

## 2017-03-31 NOTE — Progress Notes (Signed)
Report called to Estate manager/land agent at [facility] [employer] .  Patient to be transported via [facility].

## 2017-04-03 LAB — CULTURE, BLOOD (ROUTINE X 2)
CULTURE: NO GROWTH
Culture: NO GROWTH
SPECIAL REQUESTS: ADEQUATE
SPECIAL REQUESTS: ADEQUATE

## 2018-03-11 DIAGNOSIS — A419 Sepsis, unspecified organism: Secondary | ICD-10-CM

## 2018-03-11 HISTORY — DX: Sepsis, unspecified organism: A41.9

## 2018-04-09 ENCOUNTER — Encounter: Payer: Self-pay | Admitting: *Deleted

## 2018-04-14 ENCOUNTER — Ambulatory Visit (INDEPENDENT_AMBULATORY_CARE_PROVIDER_SITE_OTHER): Payer: Medicare Other | Admitting: Diagnostic Neuroimaging

## 2018-04-14 ENCOUNTER — Encounter

## 2018-04-14 ENCOUNTER — Encounter: Payer: Self-pay | Admitting: Diagnostic Neuroimaging

## 2018-04-14 VITALS — BP 94/54 | HR 67 | Ht 72.0 in

## 2018-04-14 DIAGNOSIS — G5631 Lesion of radial nerve, right upper limb: Secondary | ICD-10-CM | POA: Diagnosis not present

## 2018-04-14 NOTE — Patient Instructions (Signed)
84 y.o. 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 Radial neuropathy, right     PLAN: - symptoms have resolved; no need for wrist splint at this time  Return if symptoms worsen or fail to improve, for return to PCP.

## 2018-04-14 NOTE — Progress Notes (Signed)
[facility]  PATIENT: Nicholas Herring DOB: 1927-04-18  REFERRING CLINICIAN: S Patel HISTORY FROM: patient and chart review (and facility nurse via phone) REASON FOR VISIT: new consult    HISTORICAL  CHIEF COMPLAINT:  Chief Complaint  Patient presents with  . New Patient (Initial Visit)    Rm 7, Resides at [facility]  . R Wrist drop, R/O Radial nerve Palsy    Referral Dr. Allena Katz    HISTORY OF PRESENT ILLNESS:   84 year old male here for evaluation of right hand weakness, wrist drop and radial nerve palsy.  June 2019 patient fell down while working with physical therapy.  Immediately following this he had right hand weakness.  Patient was diagnosed with right wrist drop and referred to neurology.  He was treated with a right hand/wrist splint.  Since that time his weakness has improved.  Patient denies any weakness or numbness in his right hand.  He does have some right shoulder pain.  Patient also has diagnosis of dementia.  He is quite talkative and tangential during our conversation and visit.  He is not sure when his right hand problem started.  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  He tells me that he has 7 children who are teenagers.  I called [facility] and spoke with the nurse manager, who gave me some additional information about patient's symptoms, as summarized above.  REVIEW OF SYSTEMS: Full 14 system review of systems performed and negative with exception of: Memory loss confusion blurred vision hearing loss.  ALLERGIES: Allergies  Allergen Reactions  . Nsaids Other (See Comments)    Ulceration   . Codeine Other (See Comments)    Unknown reaction  . Morphine And Related Nausea And Vomiting    HOME MEDICATIONS: Outpatient Medications Prior to Visit  Medication Sig Dispense Refill  . acetaminophen (TYLENOL) 500 MG tablet Take 500 mg by mouth daily. May also take q6hprn    . cetirizine (ZYRTEC) 10 MG  tablet Take 1 tablet (10 mg total) by mouth at bedtime as needed for allergies. 30 tablet 0  . Cholecalciferol (VITAMIN D-3) 1000 units CAPS Take 1,000 Units by mouth every evening.    . docusate sodium (COLACE) 100 MG capsule Take 1 capsule (100 mg total) by mouth 2 (two) times daily. 10 capsule 0  . guaiFENesin (MUCINEX CHEST CONGESTION CHILD) 100 MG/5ML liquid Take 200 mg by mouth every 4 (four) hours as needed for cough.    . memantine (NAMENDA) 10 MG tablet Take 10 mg by mouth every evening.     . Omega-3 Fatty Acids (FISH OIL) 1200 MG CAPS Take 1,200 mg by mouth every evening.    Marland Kitchen UNABLE TO FIND Med Name: Floyce Stakes. Po TID    . calamine lotion Apply topically as needed for itching. 120 mL 0  . feeding supplement, ENSURE ENLIVE, (ENSURE ENLIVE) LIQD Take 237 mLs by mouth 2 (two) times daily between meals. 237 mL 12   No facility-administered medications prior to visit.     PAST MEDICAL HISTORY: Past Medical History:  Diagnosis Date  . Acute urinary retention 2009   Resolved after foley & flomax  . Colon polyps   . Dementia   . Difficulty in urination   . Diverticulosis of sigmoid colon   . Enlarged prostate   . Incarcerated incisional hernia 2009  . Macular degeneration    Right eye  . Osteoarthritis of both knees 08/17/2013  .  Seborrheic keratoses, inflamed 2013  . Sepsis (HCC) 03/2018  . Sleep apnea    pt has CPAP machine but does not wear at bedtime    PAST SURGICAL HISTORY: Past Surgical History:  Procedure Laterality Date  . CATARACT EXTRACTION Left   . CATARACT EXTRACTION W/PHACO Right 05/22/2016   Procedure: CATARACT EXTRACTION PHACO AND INTRAOCULAR LENS PLACEMENT (IOC);  Surgeon: Nevada Crane, MD;  Location: [facility] ORS;  Service: Ophthalmology;  Laterality: Right;  Lot# [id number] H [place]: 03:33.8 AP%: 15.8 CDE: 33.90  . CHOLECYSTECTOMY  1987  . ESOPHAGOGASTRODUODENOSCOPY N/A 08/26/2013   Procedure: ESOPHAGOGASTRODUODENOSCOPY (EGD);  Surgeon: Shirley Friar,  MD;  Location: [facility];  Service: Endoscopy;  Laterality: N/A;  . INGUINAL HERNIA REPAIR Right infant  . INGUINAL HERNIA REPAIR Bilateral 08/23/2013   Procedure: LAPAROSCOPIC Bilateral INGUINAL HERNIA Repairs and TAPP block    ;  Surgeon: Ardeth Sportsman, MD;  Location: [facility] OR;  Service: General;  Laterality: Bilateral;  . INSERTION OF MESH Bilateral 08/23/2013   Procedure: INSERTION OF MESH ;  Surgeon: Ardeth Sportsman, MD;  Location: [facility] OR;  Service: General;  Laterality: Bilateral;  . LAPAROSCOPIC ASSISTED VENTRAL HERNIA REPAIR  2009   Dr Ezzard Standing  . LAPAROSCOPIC LYSIS OF ADHESIONS N/A 08/23/2013   Procedure: LAPAROSCOPIC LYSIS OF ADHESIONS;  Surgeon: Ardeth Sportsman, MD;  Location: [facility] OR;  Service: General;  Laterality: N/A;  . STOMACH SURGERY  1990   reflux surgery?    FAMILY HISTORY: No family history on file.  SOCIAL HISTORY: Social History   Socioeconomic History  . Marital status: Widowed    Spouse name: Not on file  . Number of children: 4  . Years of education: Not on file  . Highest education level: Not on file  Occupational History  . Occupation: Retired  . Occupation: Lawyer/Judge  Social Needs  . Financial resource strain: Not on file  . Food insecurity:    Worry: Not on file    Inability: Not on file  . Transportation needs:    Medical: Not on file    Non-medical: Not on file  Tobacco Use  . Smoking status: Former Smoker    Last attempt to quit: 08/12/1971    Years since quitting: 46.7  . Smokeless tobacco: Never Used  Substance and Sexual Activity  . Alcohol use: Yes    Alcohol/week: 6.0 standard drinks    Types: 6 Cans of beer per week    Comment: weekly  . Drug use: No  . Sexual activity: Never  Lifestyle  . Physical activity:    Days per week: Not on file    Minutes per session: Not on file  . Stress: Not on file  Relationships  . Social connections:    Talks on phone: Not on file    Gets together: Not on file    Attends religious service: Not  on file    Active member of club or organization: Not on file    Attends meetings of clubs or organizations: Not on file    Relationship status: Not on file  . Intimate partner violence:    Fear of current or ex partner: Not on file    Emotionally abused: Not on file    Physically abused: Not on file    Forced sexual activity: Not on file  Other Topics Concern  . Not on file  Social History Narrative   Lives with his wife.  [employer], class of '52.  PHYSICAL EXAM  GENERAL EXAM/CONSTITUTIONAL: Vitals:  Vitals:   04/14/18 0952  BP: (!) 94/54  Pulse: 67  Height: 6' (1.829 m)     Body mass index is 23.06 kg/m. Wt Readings from Last 3 Encounters:  03/29/17 170 lb (77.1 kg)  08/22/16 185 lb (83.9 kg)  05/22/16 160 lb (72.6 kg)     Patient is in no distress; well developed, nourished and groomed; neck is supple  CARDIOVASCULAR:  Examination of carotid arteries is normal; no carotid bruits  Regular rate and rhythm, no murmurs  Examination of peripheral vascular system by observation and palpation is normal  EYES:  Ophthalmoscopic exam of optic discs and posterior segments is normal; no papilledema or hemorrhages  No exam data present  MUSCULOSKELETAL:  Gait, strength, tone, movements noted in Neurologic exam below  NEUROLOGIC: MENTAL STATUS:  No flowsheet data found.  awake, alert, oriented to person  [facility] memory   DECR attention and concentration  TANGENTIAL  LIMITED fund of knowledge  CRANIAL NERVE:   2nd - no papilledema on fundoscopic exam  2nd, 3rd, 4th, 6th - pupils equal and reactive to light, visual fields full to confrontation, extraocular muscles intact, no nystagmus  5th - facial sensation symmetric  7th - facial strength symmetric  8th - hearing intact  9th - palate elevates symmetrically, uvula midline  11th - shoulder shrug symmetric  12th - tongue protrusion midline  MOTOR:   normal bulk and tone,  full strength in the BUE EXCEPT IN RIGHT DELTOID LIMITED DUE TO PAIN  RIGHT HAND WRIST EXT AND FINGER EXT NORMAL  BLE 4/5  SENSORY:   normal and symmetric to light touch  COORDINATION:   finger-nose-finger, fine finger movements SLOW  REFLEXES:   deep tendon reflexes TRACE and symmetric  GAIT/STATION:   IN WHEELCHAIR; UNSTEADY TO STAND     DIAGNOSTIC DATA (LABS, IMAGING, TESTING) - I reviewed patient records, labs, notes, testing and imaging myself where available.  Lab Results  Component Value Date   WBC 11.4 (H) 03/30/2017   HGB 12.8 (L) 03/30/2017   HCT 38.7 (L) 03/30/2017   MCV 87.0 03/30/2017   PLT 187 03/30/2017      Component Value Date/Time   NA 139 03/31/2017 0934   K 3.8 03/31/2017 0934   CL 107 03/31/2017 0934   CO2 26 03/31/2017 0934   GLUCOSE 100 (H) 03/31/2017 0934   BUN 16 03/31/2017 0934   CREATININE 0.59 (L) 03/31/2017 0934   CALCIUM 8.3 (L) 03/31/2017 0934   PROT 5.6 (L) 03/30/2017 0840   ALBUMIN 2.8 (L) 03/30/2017 0840   AST 20 03/30/2017 0840   ALT 15 (L) 03/30/2017 0840   ALKPHOS 70 03/30/2017 0840   BILITOT 0.9 03/30/2017 0840   GFRNONAA >60 03/31/2017 0934   GFRAA >60 03/31/2017 0934   No results found for: CHOL, HDL, LDLCALC, LDLDIRECT, TRIG, CHOLHDL Lab Results  Component Value Date   HGBA1C 5.5 08/22/2016   No results found for: VITAMINB12 No results found for: TSH   03/29/17 CT head [I reviewed images myself and agree with interpretation. -VRP]  1. No acute intracranial pathology seen on CT. 2. Moderately severe cortical volume loss and scattered small vessel ischemic microangiopathy.   ASSESSMENT AND PLAN  84 y.o. 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 Radial neuropathy, right     PLAN: - symptoms have resolved; no need for wrist splint at this time  Return if symptoms worsen  or fail to improve, for return to PCP.    Suanne Marker, MD 04/14/2018, 10:09  AM Certified in Neurology, Neurophysiology and Neuroimaging  [facility] [address] [place], [place] [address] [phone]

## 2018-04-14 NOTE — Progress Notes (Signed)
Wrist splint found I room.  Mailed to [employer].

## 2018-04-20 IMAGING — CT CT HEAD W/O CM
5 of 8 series · 17 of 47 positions shown, 18 images · non-contrast
Comparison: CT of the head performed 07/19/2009

CLINICAL DATA: Acute onset of vomiting.  Initial encounter.

EXAM:
CT HEAD WITHOUT CONTRAST
TECHNIQUE: Contiguous axial images were obtained from the base of the skull
through the vertex without intravenous contrast.

[Series 2: head w/o · axial · non-contrast · 0.45mm/px · z∈[-125,-35]mm · 4 of 32 slices shown, 5 images (1 of 2)]
[im 7/32  brain]
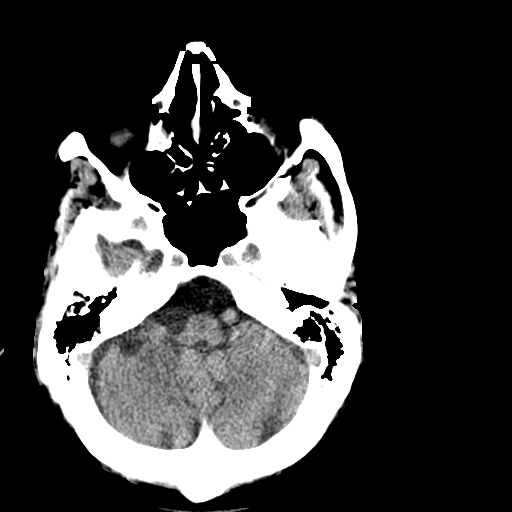
[im 7/32  bone]
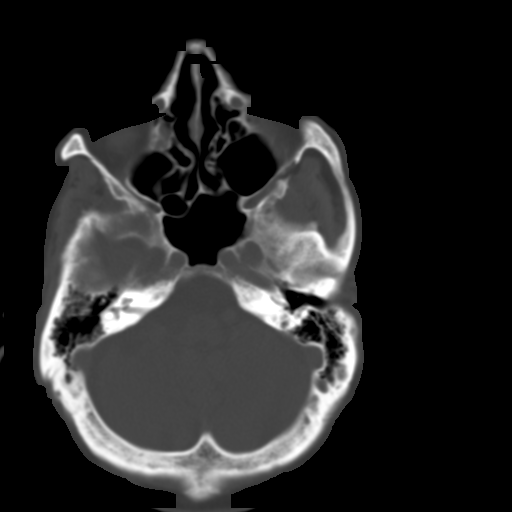
[im 13/32  brain]
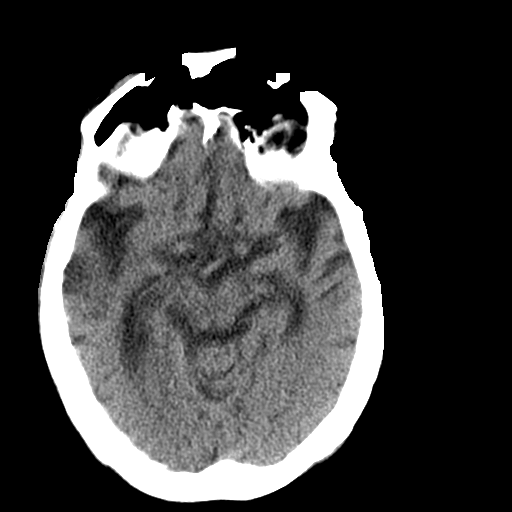
[im 19/32  brain]
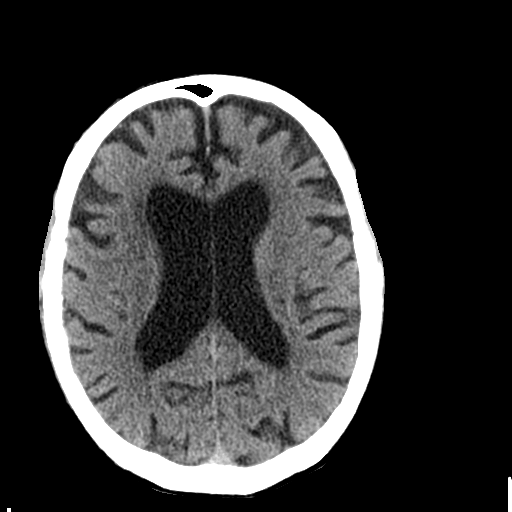
[im 25/32  brain]
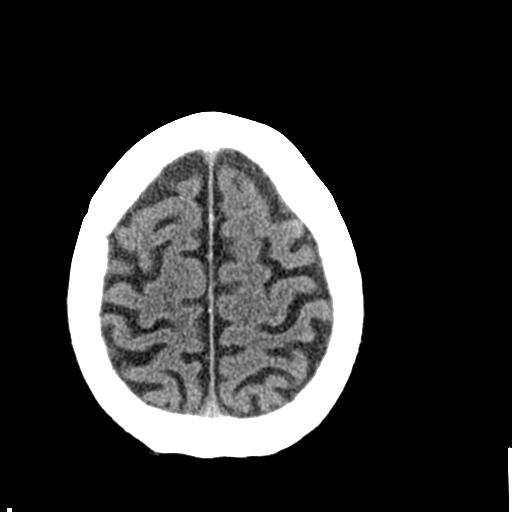

[Series 3: bone windows · axial · 0.45mm/px · z∈[-137,-77]mm · 4 of 53 slices shown]
[im 7/53  bone]
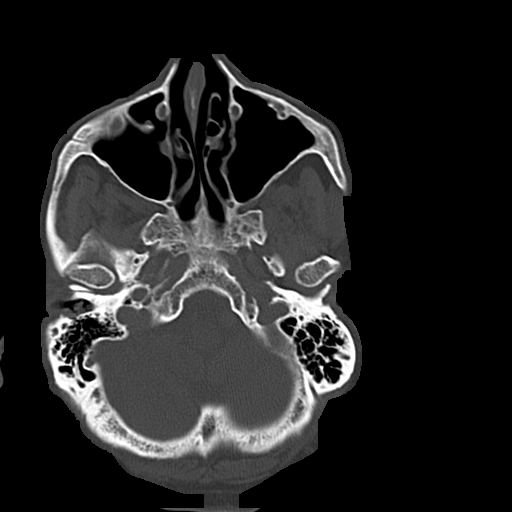
[im 14/53  bone]
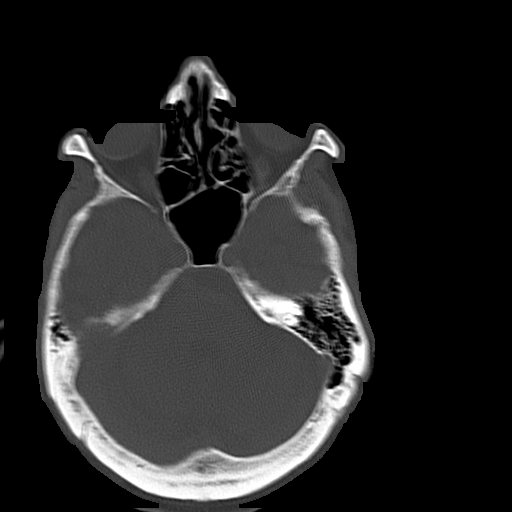
[im 20/53  bone]
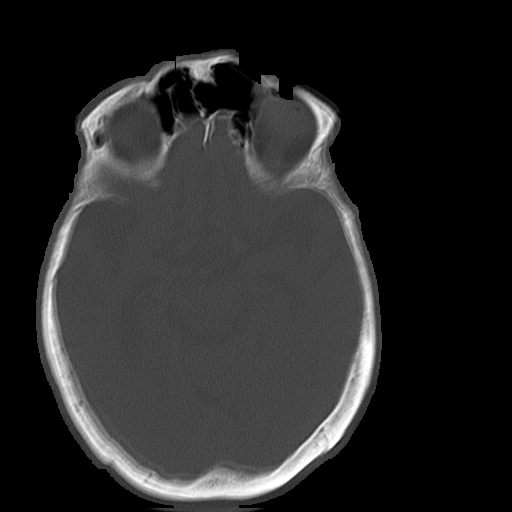
[im 27/53  bone]
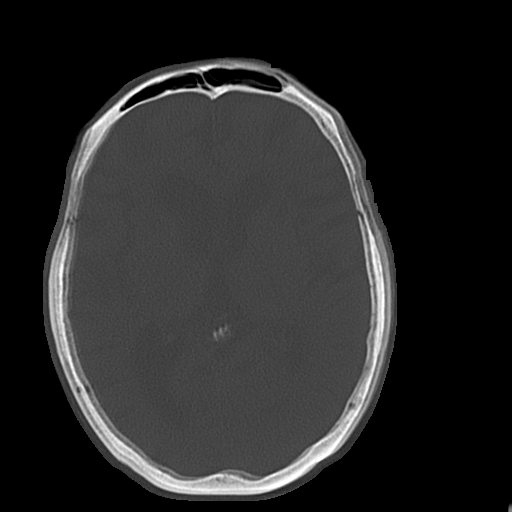

[Series 5: head w/o · axial · non-contrast · 0.45mm/px · z∈[-125,-35]mm · 4 of 32 slices shown (2 of 2)]
[im 7/32  brain]
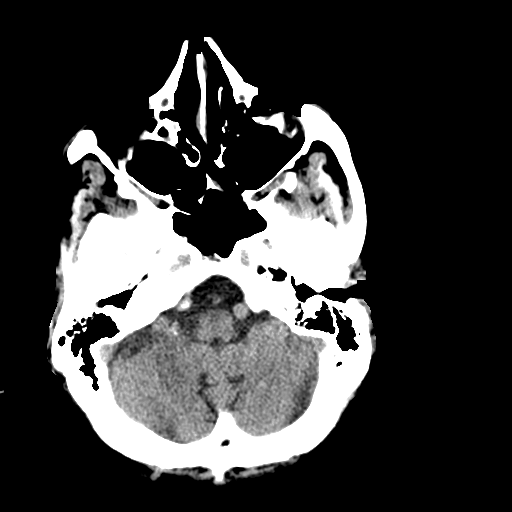
[im 13/32  brain]
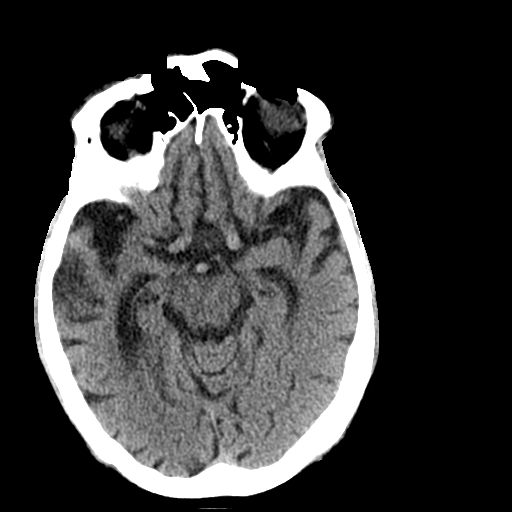
[im 19/32  brain]
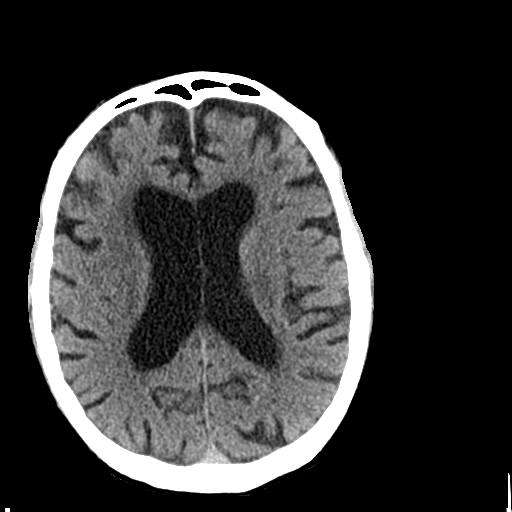
[im 25/32  brain]
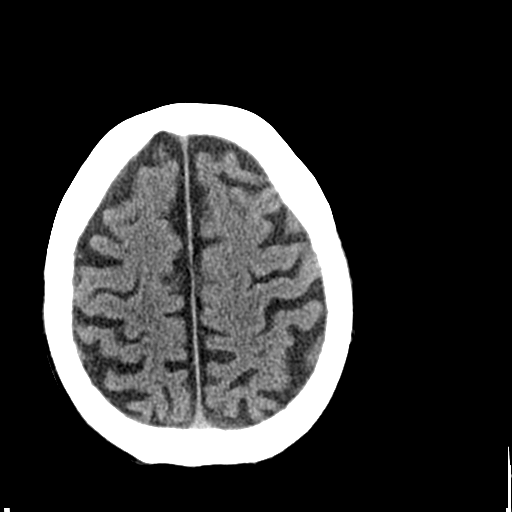

[Series 8: coronal · coronal · 0.32mm/px · 3 of 67 slices shown]
[im 17/67  brain]
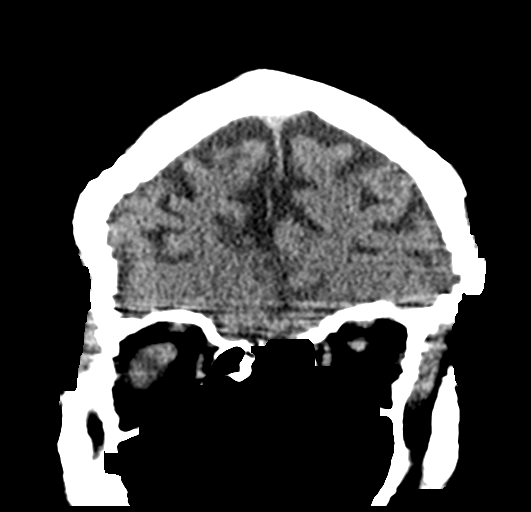
[im 34/67  brain]
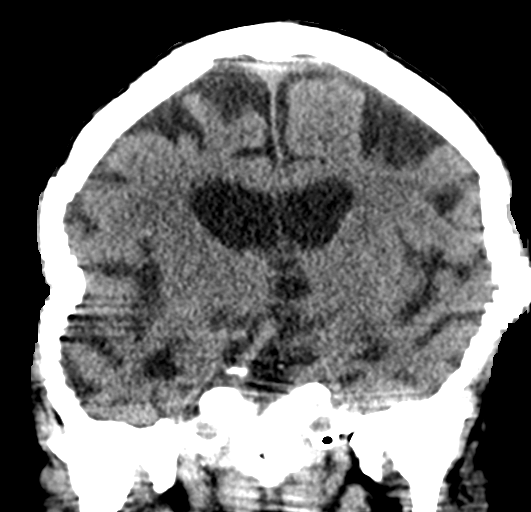
[im 50/67  brain]
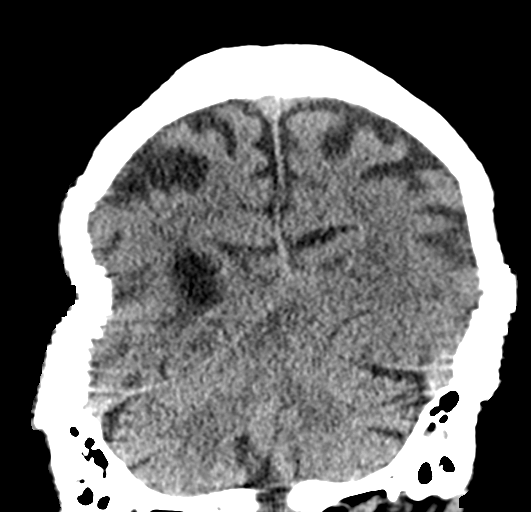

[Series 9: sagittal · sagittal · 0.33mm/px · 2 of 54 slices shown]
[im 18/54  brain]
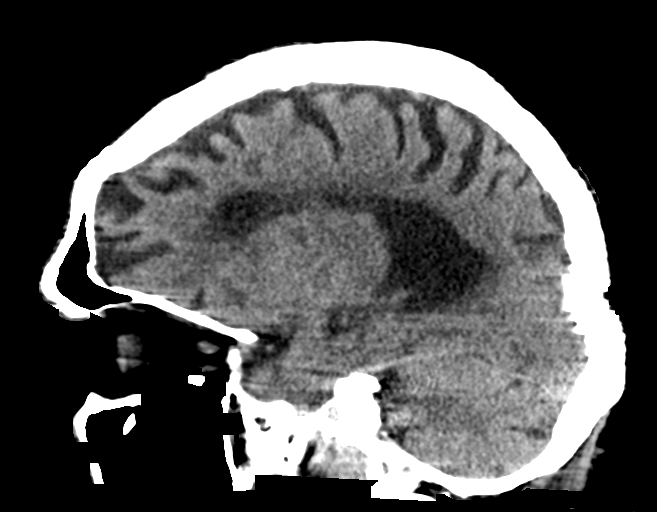
[im 36/54  brain]
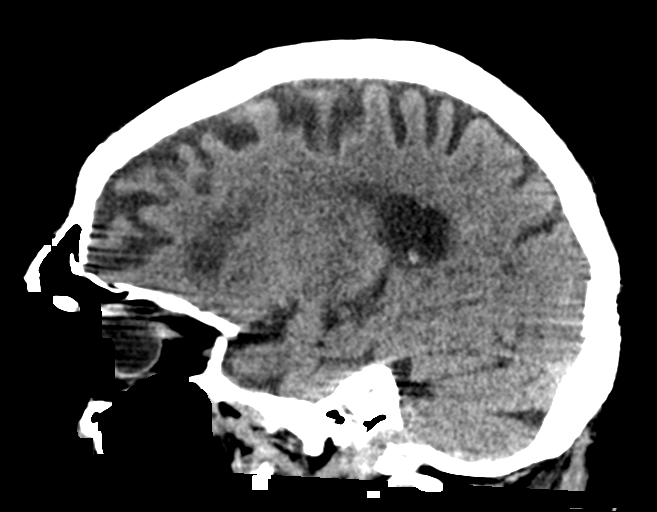

[17 of 47 positions shown; findings below may reference images not displayed]

FINDINGS: Brain: No evidence of acute infarction, hemorrhage, hydrocephalus,
extra-axial collection or mass lesion/mass effect.

Prominence of the ventricles and sulci reflects moderately severe
cortical volume loss. Scattered periventricular and subcortical
matter change likely reflects small vessel ischemic microangiopathy.
Cerebellar atrophy is noted.

The brainstem and fourth ventricle are within normal limits. The
basal ganglia are unremarkable in appearance. The cerebral
hemispheres demonstrate grossly normal gray-white differentiation.
No mass effect or midline shift is seen.

Vascular: No hyperdense vessel or unexpected calcification.

Skull: There is no evidence of fracture; visualized osseous
structures are unremarkable in appearance.

Sinuses/Orbits: The orbits are within normal limits. The paranasal
sinuses and mastoid air cells are well-aerated.

Other: No significant soft tissue abnormalities are seen.
IMPRESSION: 1. No acute intracranial pathology seen on CT.
2. Moderately severe cortical volume loss and scattered small vessel
ischemic microangiopathy.

## 2018-10-18 ENCOUNTER — Other Ambulatory Visit (HOSPITAL_COMMUNITY)
Admission: RE | Admit: 2018-10-18 | Discharge: 2018-10-18 | Disposition: A | Discharge status: Home / Self Care | Payer: Medicare Other | Source: Other Acute Inpatient Hospital | Attending: Internal Medicine | Admitting: Internal Medicine

## 2018-10-18 ENCOUNTER — Encounter (HOSPITAL_BASED_OUTPATIENT_CLINIC_OR_DEPARTMENT_OTHER): Payer: Medicare Other | Attending: Internal Medicine

## 2018-10-18 DIAGNOSIS — G4730 Sleep apnea, unspecified: Secondary | ICD-10-CM | POA: Diagnosis not present

## 2018-10-18 DIAGNOSIS — F0390 Unspecified dementia without behavioral disturbance: Secondary | ICD-10-CM | POA: Diagnosis not present

## 2018-10-18 DIAGNOSIS — L89310 Pressure ulcer of right buttock, unstageable: Secondary | ICD-10-CM | POA: Insufficient documentation

## 2018-10-18 DIAGNOSIS — Z87891 Personal history of nicotine dependence: Secondary | ICD-10-CM | POA: Diagnosis not present

## 2018-10-18 DIAGNOSIS — L89314 Pressure ulcer of right buttock, stage 4: Secondary | ICD-10-CM | POA: Diagnosis present

## 2018-10-22 LAB — AEROBIC CULTURE W GRAM STAIN (SUPERFICIAL SPECIMEN)

## 2018-11-01 DIAGNOSIS — L89310 Pressure ulcer of right buttock, unstageable: Secondary | ICD-10-CM | POA: Diagnosis not present

## 2019-09-12 DEATH — deceased
